# Patient Record
Sex: Female | Born: 1945
Health system: Southern US, Community
[De-identification: ages and names within clinical notes are randomized; demographics above are authoritative.]

## PROBLEM LIST (undated history)

## (undated) DIAGNOSIS — F329 Major depressive disorder, single episode, unspecified: Secondary | ICD-10-CM

## (undated) DIAGNOSIS — M792 Neuralgia and neuritis, unspecified: Secondary | ICD-10-CM

## (undated) DIAGNOSIS — F32A Depression, unspecified: Secondary | ICD-10-CM

## (undated) DIAGNOSIS — J449 Chronic obstructive pulmonary disease, unspecified: Secondary | ICD-10-CM

## (undated) DIAGNOSIS — I10 Essential (primary) hypertension: Secondary | ICD-10-CM

## (undated) DIAGNOSIS — M1990 Unspecified osteoarthritis, unspecified site: Secondary | ICD-10-CM

## (undated) DIAGNOSIS — R0602 Shortness of breath: Secondary | ICD-10-CM

## (undated) HISTORY — PX: FRACTURE SURGERY: SHX138

## (undated) HISTORY — DX: Chronic obstructive pulmonary disease, unspecified: J44.9

## (undated) HISTORY — PX: HERNIA REPAIR: SHX51

## (undated) HISTORY — PX: EXPLORATION MIDDLE EAR: SUR585

---

## 1998-07-10 HISTORY — PX: CHOLECYSTECTOMY: SHX55

## 2006-01-02 ENCOUNTER — Ambulatory Visit: Payer: Self-pay | Admitting: Cardiology

## 2006-01-09 ENCOUNTER — Ambulatory Visit: Payer: Self-pay | Admitting: Cardiology

## 2006-01-11 ENCOUNTER — Ambulatory Visit: Payer: Self-pay | Admitting: Cardiology

## 2006-02-08 ENCOUNTER — Ambulatory Visit: Payer: Self-pay | Admitting: Cardiology

## 2012-05-09 ENCOUNTER — Encounter (HOSPITAL_COMMUNITY): Payer: Self-pay | Admitting: Pharmacy Technician

## 2012-05-10 NOTE — Patient Instructions (Signed)
Kayla Payne  05/10/2012   Your procedure is scheduled on:  05/16/12  Report to [facility] at 1140 AM.  Call this number if you have problems the morning of surgery: [phone]   Remember:   Do not eat food:After Midnight.  May have clear liquids:until Midnight .  Clear liquids include soda, tea, black coffee, apple or grape juice, broth.  Take these medicines the morning of surgery with A SIP OF WATER: prozac, norvasc, hyzaar, neurontin   Do not wear jewelry, make-up or nail polish.  Do not wear lotions, powders, or perfumes. You may wear deodorant.  Do not shave 48 hours prior to surgery. Men may shave face and neck.  Do not bring valuables to the hospital.  Contacts, dentures or bridgework may not be worn into surgery.  Leave suitcase in the car. After surgery it may be brought to your room.  For patients admitted to the hospital, checkout time is 11:00 AM the day of discharge.   Patients discharged the day of surgery will not be allowed to drive home.  Name and phone number of your driver: family  Special Instructions: N/A   Please read over the following fact sheets that you were given: Anesthesia Post-op Instructions and Care and Recovery After Surgery   PATIENT INSTRUCTIONS POST-ANESTHESIA  IMMEDIATELY FOLLOWING SURGERY:  Do not drive or operate machinery for the first twenty four hours after surgery.  Do not make any important decisions for twenty four hours after surgery or while taking narcotic pain medications or sedatives.  If you develop intractable nausea and vomiting or a severe headache please notify your doctor immediately.  FOLLOW-UP:  Please make an appointment with your surgeon as instructed. You do not need to follow up with anesthesia unless specifically instructed to do so.  WOUND CARE INSTRUCTIONS (if applicable):  Keep a dry clean dressing on the anesthesia/puncture wound site if there is drainage.  Once the wound has quit draining you may leave it open  to air.  Generally you should leave the bandage intact for twenty four hours unless there is drainage.  If the epidural site drains for more than 36-48 hours please call the anesthesia department.  QUESTIONS?:  Please feel free to call your physician or the hospital operator if you have any questions, and they will be happy to assist you.      Cataract Surgery  A cataract is a clouding of the lens of the eye. When a lens becomes cloudy, vision is reduced based on the degree and nature of the clouding. Surgery may be needed to improve vision. Surgery removes the cloudy lens and usually replaces it with a substitute lens (intraocular lens, IOL). LET YOUR EYE DOCTOR KNOW ABOUT:  Allergies to food or medicine.  Medicines taken including herbs, eyedrops, over-the-counter medicines, and creams.  Use of steroids (by mouth or creams).  Previous problems with anesthetics or numbing medicine.  History of bleeding problems or blood clots.  Previous surgery.  Other health problems, including diabetes and kidney problems.  Possibility of pregnancy, if this applies. RISKS AND COMPLICATIONS  Infection.  Inflammation of the eyeball (endophthalmitis) that can spread to both eyes (sympathetic ophthalmia).  Poor wound healing.  If an IOL is inserted, it can later fall out of proper position. This is very uncommon.  Clouding of the part of your eye that holds an IOL in place. This is called an "after-cataract." These are uncommon, but easily treated. BEFORE THE PROCEDURE  Do not  eat or drink anything except small amounts of water for 8 to 12 before your surgery, or as directed by your caregiver.  Unless you are told otherwise, continue any eyedrops you have been prescribed.  Talk to your primary caregiver about all other medicines that you take (both prescription and non-prescription). In some cases, you may need to stop or change medicines near the time of your surgery. This is most important  if you are taking blood-thinning medicine.Do not stop medicines unless you are told to do so.  Arrange for someone to drive you to and from the procedure.  Do not put contact lenses in either eye on the day of your surgery. PROCEDURE There is more than one method for safely removing a cataract. Your doctor can explain the differences and help determine which is best for you. Phacoemulsification surgery is the most common form of cataract surgery.  An injection is given behind the eye or eyedrops are given to make this a painless procedure.  A small cut (incision) is made on the edge of the clear, dome-shaped surface that covers the front of the eye (cornea).  A tiny probe is painlessly inserted into the eye. This device gives off ultrasound waves that soften and break up the cloudy center of the lens. This makes it easier for the cloudy lens to be removed by suction.  An IOL may be implanted.  The normal lens of the eye is covered by a clear capsule. Part of that capsule is intentionally left in the eye to support the IOL.  Your surgeon may or may not use stitches to close the incision. There are other forms of cataract surgery that require a larger incision and stiches to close the eye. This approach is taken in cases where the doctor feels that the cataract cannot be easily removed using phacoemulsification. AFTER THE PROCEDURE  When an IOL is implanted, it does not need care. It becomes a permanent part of your eye and cannot be seen or felt.  Your doctor will schedule follow-up exams to check on your progress.  Review your other medicines with your doctor to see which can be resumed after surgery.  Use eyedrops or take medicine as prescribed by your doctor. Document Released: 06/15/2011 Document Revised: 09/18/2011 Document Reviewed: 06/15/2011 [facility] Patient Information 2013 [place].

## 2012-05-13 ENCOUNTER — Encounter (HOSPITAL_COMMUNITY)
Admission: RE | Admit: 2012-05-13 | Discharge: 2012-05-13 | Disposition: A | Discharge status: Home / Self Care | Payer: Medicare Other | Source: Ambulatory Visit | Attending: Ophthalmology | Admitting: Ophthalmology

## 2012-05-13 ENCOUNTER — Encounter (HOSPITAL_COMMUNITY): Payer: Self-pay

## 2012-05-13 HISTORY — DX: Essential (primary) hypertension: I10

## 2012-05-13 HISTORY — DX: Unspecified osteoarthritis, unspecified site: M19.90

## 2012-05-13 HISTORY — DX: Neuralgia and neuritis, unspecified: M79.2

## 2012-05-13 HISTORY — DX: Major depressive disorder, single episode, unspecified: F32.9

## 2012-05-13 HISTORY — DX: Depression, unspecified: F32.A

## 2012-05-13 LAB — BASIC METABOLIC PANEL
CO2: 28 mEq/L (ref 19–32)
Chloride: 101 mEq/L (ref 96–112)
Creatinine, Ser: 1.13 mg/dL — ABNORMAL HIGH (ref 0.50–1.10)
GFR calc Af Amer: 57 mL/min — ABNORMAL LOW (ref >90)
Sodium: 139 mEq/L (ref 135–145)

## 2012-05-13 LAB — HEMOGLOBIN AND HEMATOCRIT, BLOOD
HCT: 41.5 % (ref 36.0–46.0)
Hemoglobin: 13.8 g/dL (ref 12.0–15.0)

## 2012-05-15 MED ORDER — PHENYLEPHRINE HCL 2.5 % OP SOLN
OPHTHALMIC | Status: AC
  Filled 2012-05-15: qty 2

## 2012-05-15 MED ORDER — LIDOCAINE HCL 3.5 % OP GEL
OPHTHALMIC | Status: AC
  Filled 2012-05-15: qty 5

## 2012-05-15 MED ORDER — LIDOCAINE HCL (PF) 1 % IJ SOLN
INTRAMUSCULAR | Status: AC
  Filled 2012-05-15: qty 2

## 2012-05-15 MED ORDER — NEOMYCIN-POLYMYXIN-DEXAMETH 3.5-10000-0.1 OP OINT
TOPICAL_OINTMENT | OPHTHALMIC | Status: AC
  Filled 2012-05-15: qty 3.5

## 2012-05-15 MED ORDER — CYCLOPENTOLATE-PHENYLEPHRINE 0.2-1 % OP SOLN
OPHTHALMIC | Status: AC
  Filled 2012-05-15: qty 2

## 2012-05-15 MED ORDER — TETRACAINE HCL 0.5 % OP SOLN
OPHTHALMIC | Status: AC
  Filled 2012-05-15: qty 2

## 2012-05-16 ENCOUNTER — Ambulatory Visit (HOSPITAL_COMMUNITY): Payer: Medicare Other | Admitting: Anesthesiology

## 2012-05-16 ENCOUNTER — Encounter (HOSPITAL_COMMUNITY): Payer: Self-pay | Admitting: *Deleted

## 2012-05-16 ENCOUNTER — Ambulatory Visit (HOSPITAL_COMMUNITY)
Admission: RE | Admit: 2012-05-16 | Discharge: 2012-05-16 | Disposition: A | Discharge status: Home / Self Care | Payer: Medicare Other | Source: Ambulatory Visit | Attending: Ophthalmology | Admitting: Ophthalmology

## 2012-05-16 ENCOUNTER — Encounter (HOSPITAL_COMMUNITY): Payer: Self-pay | Admitting: Anesthesiology

## 2012-05-16 ENCOUNTER — Encounter (HOSPITAL_COMMUNITY)
Admission: RE | Disposition: A | Discharge status: Home / Self Care | Payer: Self-pay | Source: Ambulatory Visit | Attending: Ophthalmology

## 2012-05-16 DIAGNOSIS — I10 Essential (primary) hypertension: Secondary | ICD-10-CM | POA: Insufficient documentation

## 2012-05-16 DIAGNOSIS — Z01810 Encounter for preprocedural cardiovascular examination: Secondary | ICD-10-CM | POA: Insufficient documentation

## 2012-05-16 DIAGNOSIS — Z01812 Encounter for preprocedural laboratory examination: Secondary | ICD-10-CM | POA: Insufficient documentation

## 2012-05-16 DIAGNOSIS — H2589 Other age-related cataract: Secondary | ICD-10-CM | POA: Insufficient documentation

## 2012-05-16 DIAGNOSIS — E119 Type 2 diabetes mellitus without complications: Secondary | ICD-10-CM | POA: Insufficient documentation

## 2012-05-16 HISTORY — PX: CATARACT EXTRACTION W/PHACO: SHX586

## 2012-05-16 LAB — GLUCOSE, CAPILLARY: Glucose-Capillary: 136 mg/dL — ABNORMAL HIGH (ref 70–99)

## 2012-05-16 SURGERY — PHACOEMULSIFICATION, CATARACT, WITH IOL INSERTION
Anesthesia: Monitor Anesthesia Care | Site: Eye | Laterality: Left | Wound class: Clean

## 2012-05-16 MED ORDER — MIDAZOLAM HCL 2 MG/2ML IJ SOLN
1.0000 mg | INTRAMUSCULAR | Status: DC | PRN
  Administered 2012-05-16: 2 mg via INTRAVENOUS

## 2012-05-16 MED ORDER — NEOMYCIN-POLYMYXIN-DEXAMETH 0.1 % OP OINT
TOPICAL_OINTMENT | OPHTHALMIC | Status: DC | PRN
  Administered 2012-05-16: 1 via OPHTHALMIC

## 2012-05-16 MED ORDER — TETRACAINE HCL 0.5 % OP SOLN
1.0000 [drp] | OPHTHALMIC | Status: AC
  Administered 2012-05-16 (×3): 1 [drp] via OPHTHALMIC

## 2012-05-16 MED ORDER — PROVISC 10 MG/ML IO SOLN
INTRAOCULAR | Status: DC | PRN
  Administered 2012-05-16: 8.5 mg via INTRAOCULAR

## 2012-05-16 MED ORDER — BSS IO SOLN
INTRAOCULAR | Status: DC | PRN
  Administered 2012-05-16: 15 mL via INTRAOCULAR

## 2012-05-16 MED ORDER — LACTATED RINGERS IV SOLN
INTRAVENOUS | Status: DC
  Administered 2012-05-16: 1000 mL via INTRAVENOUS

## 2012-05-16 MED ORDER — LIDOCAINE HCL 3.5 % OP GEL
1.0000 "application " | Freq: Once | OPHTHALMIC | Status: AC
  Administered 2012-05-16: 1 via OPHTHALMIC

## 2012-05-16 MED ORDER — EPINEPHRINE HCL 1 MG/ML IJ SOLN
INTRAOCULAR | Status: DC | PRN
  Administered 2012-05-16: 13:00:00

## 2012-05-16 MED ORDER — PHENYLEPHRINE HCL 2.5 % OP SOLN
1.0000 [drp] | OPHTHALMIC | Status: AC
  Administered 2012-05-16 (×3): 1 [drp] via OPHTHALMIC

## 2012-05-16 MED ORDER — CYCLOPENTOLATE-PHENYLEPHRINE 0.2-1 % OP SOLN
1.0000 [drp] | OPHTHALMIC | Status: AC
  Administered 2012-05-16 (×3): 1 [drp] via OPHTHALMIC

## 2012-05-16 MED ORDER — LIDOCAINE 3.5 % OP GEL OPTIME - NO CHARGE
OPHTHALMIC | Status: DC | PRN
  Administered 2012-05-16: 2 [drp] via OPHTHALMIC

## 2012-05-16 MED ORDER — ONDANSETRON HCL 4 MG/2ML IJ SOLN: 4.0000 mg | Freq: Once | INTRAMUSCULAR | Status: AC | PRN

## 2012-05-16 MED ORDER — LIDOCAINE HCL (PF) 1 % IJ SOLN
INTRAMUSCULAR | Status: DC | PRN
  Administered 2012-05-16: .5 mL

## 2012-05-16 MED ORDER — POVIDONE-IODINE 5 % OP SOLN
OPHTHALMIC | Status: DC | PRN
  Administered 2012-05-16: 1 via OPHTHALMIC

## 2012-05-16 MED ORDER — LACTATED RINGERS IV SOLN
INTRAVENOUS | Status: DC | PRN
  Administered 2012-05-16: 13:00:00 via INTRAVENOUS

## 2012-05-16 MED ORDER — FENTANYL CITRATE 0.05 MG/ML IJ SOLN: 25.0000 ug | INTRAMUSCULAR | Status: DC | PRN

## 2012-05-16 MED ORDER — MIDAZOLAM HCL 2 MG/2ML IJ SOLN
INTRAMUSCULAR | Status: AC
  Filled 2012-05-16: qty 2

## 2012-05-16 SURGICAL SUPPLY — 32 items
CAPSULAR TENSION RING-AMO (OPHTHALMIC RELATED) IMPLANT
CLOTH BEACON ORANGE TIMEOUT ST (SAFETY) ×2 IMPLANT
EYE SHIELD UNIVERSAL CLEAR (GAUZE/BANDAGES/DRESSINGS) ×2 IMPLANT
GLOVE BIO SURGEON STRL SZ 6.5 (GLOVE) IMPLANT
GLOVE BIOGEL PI IND STRL 6.5 (GLOVE) ×1 IMPLANT
GLOVE BIOGEL PI IND STRL 7.0 (GLOVE) IMPLANT
GLOVE BIOGEL PI IND STRL 7.5 (GLOVE) IMPLANT
GLOVE BIOGEL PI INDICATOR 6.5 (GLOVE) ×1
GLOVE BIOGEL PI INDICATOR 7.0 (GLOVE)
GLOVE BIOGEL PI INDICATOR 7.5 (GLOVE)
GLOVE ECLIPSE 6.5 STRL STRAW (GLOVE) ×2 IMPLANT
GLOVE ECLIPSE 7.0 STRL STRAW (GLOVE) IMPLANT
GLOVE ECLIPSE 7.5 STRL STRAW (GLOVE) IMPLANT
GLOVE EXAM NITRILE LRG STRL (GLOVE) IMPLANT
GLOVE EXAM NITRILE MD LF STRL (GLOVE) ×2 IMPLANT
GLOVE SKINSENSE NS SZ6.5 (GLOVE)
GLOVE SKINSENSE NS SZ7.0 (GLOVE)
GLOVE SKINSENSE STRL SZ6.5 (GLOVE) IMPLANT
GLOVE SKINSENSE STRL SZ7.0 (GLOVE) IMPLANT
KIT VITRECTOMY (OPHTHALMIC RELATED) IMPLANT
PAD ARMBOARD 7.5X6 YLW CONV (MISCELLANEOUS) ×2 IMPLANT
PROC W NO LENS (INTRAOCULAR LENS)
PROC W SPEC LENS (INTRAOCULAR LENS)
PROCESS W NO LENS (INTRAOCULAR LENS) IMPLANT
PROCESS W SPEC LENS (INTRAOCULAR LENS) IMPLANT
RING MALYGIN (MISCELLANEOUS) IMPLANT
SIGHTPATH CAT PROC W REG LENS (Ophthalmic Related) ×2 IMPLANT
SYR TB 1ML LL NO SAFETY (SYRINGE) ×2 IMPLANT
TAPE SURG TRANSPORE 1 IN (GAUZE/BANDAGES/DRESSINGS) ×1 IMPLANT
TAPE SURGICAL TRANSPORE 1 IN (GAUZE/BANDAGES/DRESSINGS) ×1
VISCOELASTIC ADDITIONAL (OPHTHALMIC RELATED) IMPLANT
WATER STERILE IRR 250ML POUR (IV SOLUTION) ×2 IMPLANT

## 2012-05-16 NOTE — H&P (Signed)
I have reviewed the H&P, the patient was re-examined, and I have identified no interval changes in medical condition and plan of care since the history and physical of record  

## 2012-05-16 NOTE — Preoperative (Signed)
Beta Blockers   Reason not to administer Beta Blockers:Not Applicable 

## 2012-05-16 NOTE — Anesthesia Procedure Notes (Signed)
Procedure Name: MAC Date/Time: 05/16/2012 1:25 PM Performed by: Antony Contras, Tedrick Port L Pre-anesthesia Checklist: Patient identified, Patient being monitored, Emergency Drugs available, Timeout performed and Suction available Oxygen Delivery Method: Nasal cannula

## 2012-05-16 NOTE — Transfer of Care (Signed)
  Anesthesia Post-op Note  Patient: Kayla Payne  Procedure(s) Performed: Procedure(s) (LRB) with comments: CATARACT EXTRACTION PHACO AND INTRAOCULAR LENS PLACEMENT (IOC) (Left) - CDE 9.31  Patient Location: PACU  Anesthesia Type: MAC  Level of Consciousness: awake, alert , oriented and patient cooperative  Airway and Oxygen Therapy: Patient Spontanous Breathing room air  Post-op Pain: mild  Post-op Assessment: Post-op Vital signs reviewed, Patient's Cardiovascular Status Stable, Respiratory Function Stable, Patent Airway and No signs of Nausea or vomiting  Post-op Vital Signs: Reviewed and stable  Complications: No apparent anesthesia complications

## 2012-05-16 NOTE — Anesthesia Preprocedure Evaluation (Signed)
Anesthesia Evaluation  Patient identified by MRN, date of birth, ID band Patient awake    Reviewed: Allergy & Precautions, H&P , NPO status , Patient's Chart, lab work & pertinent test results  Airway Mallampati: III      Dental  (+) Edentulous Upper and Partial Lower   Pulmonary  breath sounds clear to auscultation        Cardiovascular hypertension, Pt. on medications Rhythm:Regular     Neuro/Psych PSYCHIATRIC DISORDERS Depression  Neuromuscular disease (trigeminal neuralgia)    GI/Hepatic   Endo/Other  diabetes  Renal/GU      Musculoskeletal   Abdominal   Peds  Hematology   Anesthesia Other Findings   Reproductive/Obstetrics                           Anesthesia Physical Anesthesia Plan  ASA: III  Anesthesia Plan: MAC   Post-op Pain Management:    Induction: Intravenous  Airway Management Planned: Nasal Cannula  Additional Equipment:   Intra-op Plan:   Post-operative Plan:   Informed Consent: I have reviewed the patients History and Physical, chart, labs and discussed the procedure including the risks, benefits and alternatives for the proposed anesthesia with the patient or authorized representative who has indicated his/her understanding and acceptance.     Plan Discussed with:   Anesthesia Plan Comments:         Anesthesia Quick Evaluation

## 2012-05-16 NOTE — Brief Op Note (Signed)
Pre-Op Dx: Cataract OS Post-Op Dx: Cataract OS Surgeon: Lucciana Head Anesthesia: Topical with MAC Surgery: Cataract Extraction with Intraocular lens Implant OS Implant: B&L enVista Specimen: None Complications: None 

## 2012-05-16 NOTE — Anesthesia Postprocedure Evaluation (Signed)
  Anesthesia Post-op Note  Patient: Kayla Payne  Procedure(s) Performed: Procedure(s) (LRB) with comments: CATARACT EXTRACTION PHACO AND INTRAOCULAR LENS PLACEMENT (IOC) (Left) - CDE 9.31  Patient Location: PACU  Anesthesia Type: MAC  Level of Consciousness: awake, alert , oriented and patient cooperative  Airway and Oxygen Therapy: Patient Spontanous Breathing room air  Post-op Pain: mild  Post-op Assessment: Post-op Vital signs reviewed, Patient's Cardiovascular Status Stable, Respiratory Function Stable, Patent Airway and No signs of Nausea or vomiting  Post-op Vital Signs: Reviewed and stable  Complications: No apparent anesthesia complications

## 2012-05-17 NOTE — Op Note (Signed)
Kayla Payne, MEEKER NO.:  [id number]  MEDICAL RECORD NO.:  [id number]  LOCATION:  [facility]                          FACILITY:  [facility]  PHYSICIAN:  Richardo Hanks, MD       DATE OF BIRTH:  12-03-1945  DATE OF PROCEDURE:  05/16/2012 DATE OF DISCHARGE:                              OPERATIVE REPORT   PREOPERATIVE DIAGNOSIS:  Combined cataract, left eye.  POSTOPERATIVE DIAGNOSIS:  Combined cataract, left eye.  OPERATION PERFORMED:  Phacoemulsification with posterior chamber intraocular lens implantation, left eye.  SURGEON:  Franky Macho. Debany Vantol, MD  ANESTHESIA:  Topical with monitored anesthesia care and IV sedation.  OPERATIVE SUMMARY:  In the preoperative area, dilating drops were placed into the left eye.  The patient was then brought into the operating room where she was placed under general anesthesia.  The eye was then prepped and draped.  Beginning with a 75 blade, a paracentesis port was made at the surgeon's 2 o'clock position.  The anterior chamber was then filled with a 1% nonpreserved lidocaine solution with epinephrine.  This was followed by Viscoat to deepen the chamber.  A small fornix-based peritomy was performed superiorly.  Next, a single iris hook was placed through the limbus superiorly.  A 2.4-mm keratome blade was then used to make a clear corneal incision over the iris hook.  A bent cystotome needle and Utrata forceps were used to create a continuous tear capsulotomy.  Hydrodissection was performed using balanced salt solution on a fine cannula.  The lens nucleus was then removed using phacoemulsification in a quadrant cracking technique.  The cortical material was then removed with irrigation and aspiration.  The capsular bag and anterior chamber were refilled with Provisc.  The wound was widened to approximately 3 mm and a posterior chamber intraocular lens was placed into the capsular bag without difficulty using an [employer] lens injecting  system.  A single 10-0 nylon suture was then used to close the incision as well as stromal hydration.  The Provisc was removed from the anterior chamber and capsular bag with irrigation and aspiration.  At this point, the wounds were tested for leak, which were negative.  The anterior chamber remained deep and stable.  The patient tolerated the procedure well.  There were no operative complications, and she awoke from general anesthesia without problem.  No surgical specimens.  Prosthetic device used is a [employer], model EnVista, model number MX60, power of 25.5, serial number is [id number].          ______________________________ Richardo Hanks, MD     KEH/MEDQ  D:  05/16/2012  T:  05/17/2012  Job:  [id number]

## 2012-05-20 ENCOUNTER — Encounter (HOSPITAL_COMMUNITY): Payer: Self-pay | Admitting: Ophthalmology

## 2012-08-12 ENCOUNTER — Encounter (HOSPITAL_COMMUNITY): Payer: Self-pay | Admitting: Pharmacy Technician

## 2012-08-14 NOTE — Patient Instructions (Addendum)
Your procedure is scheduled on: 08/19/2012  Report to [facility] at  59    AM.  Call this number if you have problems the morning of surgery: [phone]   Do not eat food or drink liquids :After Midnight.      Take these medicines the morning of surgery with A SIP OF WATER:prozac,vicodin,norvasc,celebrex,neurontin,hyzaar   Do not wear jewelry, make-up or nail polish.  Do not wear lotions, powders, or perfumes.   Do not shave 48 hours prior to surgery.  Do not bring valuables to the hospital.  Contacts, dentures or bridgework may not be worn into surgery.  Leave suitcase in the car. After surgery it may be brought to your room.  For patients admitted to the hospital, checkout time is 11:00 AM the day of discharge.   Patients discharged the day of surgery will not be allowed to drive home.  :     Please read over the following fact sheets that you were given: Coughing and Deep Breathing, Surgical Site Infection Prevention, Anesthesia Post-op Instructions and Care and Recovery After Surgery    Cataract A cataract is a clouding of the lens of the eye. When a lens becomes cloudy, vision is reduced based on the degree and nature of the clouding. Many cataracts reduce vision to some degree. Some cataracts make people more near-sighted as they develop. Other cataracts increase glare. Cataracts that are ignored and become worse can sometimes look white. The white color can be seen through the pupil. CAUSES   Aging. However, cataracts may occur at any age, even in newborns.   Certain drugs.   Trauma to the eye.   Certain diseases such as diabetes.   Specific eye diseases such as chronic inflammation inside the eye or a sudden attack of a rare form of glaucoma.   Inherited or acquired medical problems.  SYMPTOMS   Gradual, progressive drop in vision in the affected eye.   Severe, rapid visual loss. This most often happens when trauma is the cause.  DIAGNOSIS  To detect a cataract, an  eye doctor examines the lens. Cataracts are best diagnosed with an exam of the eyes with the pupils enlarged (dilated) by drops.  TREATMENT  For an early cataract, vision may improve by using different eyeglasses or stronger lighting. If that does not help your vision, surgery is the only effective treatment. A cataract needs to be surgically removed when vision loss interferes with your everyday activities, such as driving, reading, or watching TV. A cataract may also have to be removed if it prevents examination or treatment of another eye problem. Surgery removes the cloudy lens and usually replaces it with a substitute lens (intraocular lens, IOL).  At a time when both you and your doctor agree, the cataract will be surgically removed. If you have cataracts in both eyes, only one is usually removed at a time. This allows the operated eye to heal and be out of danger from any possible problems after surgery (such as infection or poor wound healing). In rare cases, a cataract may be doing damage to your eye. In these cases, your caregiver may advise surgical removal right away. The vast majority of people who have cataract surgery have better vision afterward. HOME CARE INSTRUCTIONS  If you are not planning surgery, you may be asked to do the following:  Use different eyeglasses.   Use stronger or brighter lighting.   Ask your eye doctor about reducing your medicine dose or changing medicines  if it is thought that a medicine caused your cataract. Changing medicines does not make the cataract go away on its own.   Become familiar with your surroundings. Poor vision can lead to injury. Avoid bumping into things on the affected side. You are at a higher risk for tripping or falling.   Exercise extreme care when driving or operating machinery.   Wear sunglasses if you are sensitive to bright light or experiencing problems with glare.  SEEK IMMEDIATE MEDICAL CARE IF:   You have a worsening or sudden  vision loss.   You notice redness, swelling, or increasing pain in the eye.   You have a fever.  Document Released: 06/26/2005 Document Revised: 06/15/2011 Document Reviewed: 02/17/2011 [facility] Patient Information 2012 [place].PATIENT INSTRUCTIONS POST-ANESTHESIA  IMMEDIATELY FOLLOWING SURGERY:  Do not drive or operate machinery for the first twenty four hours after surgery.  Do not make any important decisions for twenty four hours after surgery or while taking narcotic pain medications or sedatives.  If you develop intractable nausea and vomiting or a severe headache please notify your doctor immediately.  FOLLOW-UP:  Please make an appointment with your surgeon as instructed. You do not need to follow up with anesthesia unless specifically instructed to do so.  WOUND CARE INSTRUCTIONS (if applicable):  Keep a dry clean dressing on the anesthesia/puncture wound site if there is drainage.  Once the wound has quit draining you may leave it open to air.  Generally you should leave the bandage intact for twenty four hours unless there is drainage.  If the epidural site drains for more than 36-48 hours please call the anesthesia department.  QUESTIONS?:  Please feel free to call your physician or the hospital operator if you have any questions, and they will be happy to assist you.

## 2012-08-15 ENCOUNTER — Encounter (HOSPITAL_COMMUNITY)
Admission: RE | Admit: 2012-08-15 | Discharge: 2012-08-15 | Disposition: A | Discharge status: Home / Self Care | Payer: Medicare Other | Source: Ambulatory Visit | Attending: Ophthalmology | Admitting: Ophthalmology

## 2012-08-15 ENCOUNTER — Encounter (HOSPITAL_COMMUNITY): Payer: Self-pay

## 2012-08-15 HISTORY — DX: Shortness of breath: R06.02

## 2012-08-15 LAB — BASIC METABOLIC PANEL
BUN: 20 mg/dL (ref 6–23)
Calcium: 9.9 mg/dL (ref 8.4–10.5)
GFR calc Af Amer: 56 mL/min — ABNORMAL LOW (ref >90)
GFR calc non Af Amer: 48 mL/min — ABNORMAL LOW (ref >90)
Potassium: 3.8 mEq/L (ref 3.5–5.1)

## 2012-08-16 MED ORDER — TETRACAINE HCL 0.5 % OP SOLN
OPHTHALMIC | Status: AC
  Filled 2012-08-16: qty 2

## 2012-08-16 MED ORDER — NEOMYCIN-POLYMYXIN-DEXAMETH 3.5-10000-0.1 OP OINT
TOPICAL_OINTMENT | OPHTHALMIC | Status: AC
  Filled 2012-08-16: qty 3.5

## 2012-08-16 MED ORDER — LIDOCAINE HCL (PF) 1 % IJ SOLN
INTRAMUSCULAR | Status: AC
  Filled 2012-08-16: qty 2

## 2012-08-16 MED ORDER — LIDOCAINE HCL 3.5 % OP GEL
OPHTHALMIC | Status: AC
  Filled 2012-08-16: qty 5

## 2012-08-16 MED ORDER — CYCLOPENTOLATE-PHENYLEPHRINE 0.2-1 % OP SOLN
OPHTHALMIC | Status: AC
  Filled 2012-08-16: qty 2

## 2012-08-19 ENCOUNTER — Encounter (HOSPITAL_COMMUNITY): Payer: Self-pay | Admitting: Anesthesiology

## 2012-08-19 ENCOUNTER — Encounter (HOSPITAL_COMMUNITY)
Admission: RE | Disposition: A | Discharge status: Home / Self Care | Payer: Self-pay | Source: Ambulatory Visit | Attending: Ophthalmology

## 2012-08-19 ENCOUNTER — Ambulatory Visit (HOSPITAL_COMMUNITY): Payer: Medicare Other | Admitting: Anesthesiology

## 2012-08-19 ENCOUNTER — Encounter (HOSPITAL_COMMUNITY): Payer: Self-pay | Admitting: *Deleted

## 2012-08-19 ENCOUNTER — Ambulatory Visit (HOSPITAL_COMMUNITY)
Admission: RE | Admit: 2012-08-19 | Discharge: 2012-08-19 | Disposition: A | Discharge status: Home / Self Care | Payer: Medicare Other | Source: Ambulatory Visit | Attending: Ophthalmology | Admitting: Ophthalmology

## 2012-08-19 DIAGNOSIS — I10 Essential (primary) hypertension: Secondary | ICD-10-CM | POA: Insufficient documentation

## 2012-08-19 DIAGNOSIS — H2589 Other age-related cataract: Secondary | ICD-10-CM | POA: Insufficient documentation

## 2012-08-19 DIAGNOSIS — Z01812 Encounter for preprocedural laboratory examination: Secondary | ICD-10-CM | POA: Insufficient documentation

## 2012-08-19 HISTORY — PX: CATARACT EXTRACTION W/PHACO: SHX586

## 2012-08-19 SURGERY — PHACOEMULSIFICATION, CATARACT, WITH IOL INSERTION
Anesthesia: Monitor Anesthesia Care | Site: Eye | Laterality: Right | Wound class: Clean

## 2012-08-19 MED ORDER — BSS IO SOLN
INTRAOCULAR | Status: DC | PRN
  Administered 2012-08-19: 15 mL via INTRAOCULAR

## 2012-08-19 MED ORDER — NEOMYCIN-POLYMYXIN-DEXAMETH 0.1 % OP OINT
TOPICAL_OINTMENT | OPHTHALMIC | Status: DC | PRN
  Administered 2012-08-19: 1 via OPHTHALMIC

## 2012-08-19 MED ORDER — LIDOCAINE HCL (PF) 1 % IJ SOLN
INTRAMUSCULAR | Status: DC | PRN
  Administered 2012-08-19: .5 mL

## 2012-08-19 MED ORDER — PHENYLEPHRINE HCL 2.5 % OP SOLN
OPHTHALMIC | Status: AC
  Filled 2012-08-19: qty 2

## 2012-08-19 MED ORDER — LACTATED RINGERS IV SOLN
INTRAVENOUS | Status: DC
  Administered 2012-08-19: 11:00:00 via INTRAVENOUS

## 2012-08-19 MED ORDER — CYCLOPENTOLATE-PHENYLEPHRINE 0.2-1 % OP SOLN
1.0000 [drp] | OPHTHALMIC | Status: AC
  Administered 2012-08-19 (×3): 1 [drp] via OPHTHALMIC

## 2012-08-19 MED ORDER — NA HYALUR & NA CHOND-NA HYALUR 0.55-0.5 ML IO KIT
PACK | INTRAOCULAR | Status: DC | PRN
  Administered 2012-08-19: 1 via OPHTHALMIC

## 2012-08-19 MED ORDER — ONDANSETRON HCL 4 MG/2ML IJ SOLN: 4.0000 mg | Freq: Once | INTRAMUSCULAR | Status: DC | PRN

## 2012-08-19 MED ORDER — EPINEPHRINE HCL 1 MG/ML IJ SOLN
INTRAMUSCULAR | Status: AC
  Filled 2012-08-19: qty 1

## 2012-08-19 MED ORDER — POVIDONE-IODINE 5 % OP SOLN
OPHTHALMIC | Status: DC | PRN
  Administered 2012-08-19: 1 via OPHTHALMIC

## 2012-08-19 MED ORDER — LIDOCAINE HCL 3.5 % OP GEL
1.0000 "application " | Freq: Once | OPHTHALMIC | Status: AC
  Administered 2012-08-19: 1 via OPHTHALMIC

## 2012-08-19 MED ORDER — PROVISC 10 MG/ML IO SOLN: INTRAOCULAR | Status: DC | PRN

## 2012-08-19 MED ORDER — TETRACAINE HCL 0.5 % OP SOLN
1.0000 [drp] | OPHTHALMIC | Status: AC
  Administered 2012-08-19 (×3): 1 [drp] via OPHTHALMIC

## 2012-08-19 MED ORDER — LIDOCAINE 3.5 % OP GEL OPTIME - NO CHARGE
OPHTHALMIC | Status: DC | PRN
  Administered 2012-08-19: 1 [drp] via OPHTHALMIC

## 2012-08-19 MED ORDER — FENTANYL CITRATE 0.05 MG/ML IJ SOLN: 25.0000 ug | INTRAMUSCULAR | Status: DC | PRN

## 2012-08-19 MED ORDER — MIDAZOLAM HCL 2 MG/2ML IJ SOLN
INTRAMUSCULAR | Status: AC
  Filled 2012-08-19: qty 2

## 2012-08-19 MED ORDER — PHENYLEPHRINE HCL 2.5 % OP SOLN
1.0000 [drp] | OPHTHALMIC | Status: AC
  Administered 2012-08-19 (×3): 1 [drp] via OPHTHALMIC

## 2012-08-19 MED ORDER — EPINEPHRINE HCL 1 MG/ML IJ SOLN
INTRAOCULAR | Status: DC | PRN
  Administered 2012-08-19: 12:00:00

## 2012-08-19 MED ORDER — MIDAZOLAM HCL 2 MG/2ML IJ SOLN
1.0000 mg | INTRAMUSCULAR | Status: DC | PRN
  Administered 2012-08-19 (×2): 1 mg via INTRAVENOUS

## 2012-08-19 SURGICAL SUPPLY — 32 items

## 2012-08-19 NOTE — Anesthesia Preprocedure Evaluation (Signed)
Anesthesia Evaluation  Patient identified by MRN, date of birth, ID band Patient awake    Reviewed: Allergy & Precautions, H&P , NPO status , Patient's Chart, lab work & pertinent test results  Airway Mallampati: III      Dental  (+) Edentulous Upper and Partial Lower   Pulmonary shortness of breath,  breath sounds clear to auscultation        Cardiovascular hypertension, Pt. on medications Rhythm:Regular     Neuro/Psych PSYCHIATRIC DISORDERS Depression  Neuromuscular disease (trigeminal neuralgia)    GI/Hepatic   Endo/Other  diabetesMorbid obesity  Renal/GU      Musculoskeletal   Abdominal   Peds  Hematology   Anesthesia Other Findings   Reproductive/Obstetrics                           Anesthesia Physical Anesthesia Plan  ASA: III  Anesthesia Plan: MAC   Post-op Pain Management:    Induction: Intravenous  Airway Management Planned: Nasal Cannula  Additional Equipment:   Intra-op Plan:   Post-operative Plan:   Informed Consent: I have reviewed the patients History and Physical, chart, labs and discussed the procedure including the risks, benefits and alternatives for the proposed anesthesia with the patient or authorized representative who has indicated his/her understanding and acceptance.     Plan Discussed with:   Anesthesia Plan Comments:         Anesthesia Quick Evaluation

## 2012-08-19 NOTE — Transfer of Care (Signed)
Immediate Anesthesia Transfer of Care Note  Patient: Kayla Payne  Procedure(s) Performed: Procedure(s) with comments: CATARACT EXTRACTION PHACO AND INTRAOCULAR LENS PLACEMENT (IOC) (Right) - CDE: 9.46  Patient Location: PACU and Short Stay  Anesthesia Type:MAC  Level of Consciousness: awake  Airway & Oxygen Therapy: Patient Spontanous Breathing  Post-op Assessment: Report given to PACU RN  Post vital signs: Reviewed and stable  Complications: No apparent anesthesia complications

## 2012-08-19 NOTE — H&P (Signed)
I have reviewed the H&P, the patient was re-examined, and I have identified no interval changes in medical condition and plan of care since the history and physical of record  

## 2012-08-19 NOTE — Anesthesia Postprocedure Evaluation (Signed)
  Anesthesia Post-op Note  Patient: Kayla Payne  Procedure(s) Performed: Procedure(s) with comments: CATARACT EXTRACTION PHACO AND INTRAOCULAR LENS PLACEMENT (IOC) (Right) - CDE: 9.46  Patient Location: PACU and Short Stay  Anesthesia Type:MAC  Level of Consciousness: awake, alert  and oriented  Airway and Oxygen Therapy: Patient Spontanous Breathing  Post-op Pain: none  Post-op Assessment: Post-op Vital signs reviewed, Patient's Cardiovascular Status Stable, Respiratory Function Stable, Patent Airway and No signs of Nausea or vomiting  Post-op Vital Signs: stable  Complications: No apparent anesthesia complications

## 2012-08-19 NOTE — Brief Op Note (Signed)
Pre-Op Dx: Cataract OD Post-Op Dx: Cataract OD Surgeon: Tonny Branch Anesthesia: Topical with MAC Surgery: Cataract Extraction with Intraocular lens Implant OD Implant: B&L enVista Specimen: None Complications: None

## 2012-08-20 ENCOUNTER — Encounter (HOSPITAL_COMMUNITY): Payer: Self-pay | Admitting: Ophthalmology

## 2012-08-20 NOTE — Op Note (Signed)
[name], SI NO.:  [id number]  MEDICAL RECORD NO.:  [id number]  LOCATION:  [facility]PO                          FACILITY:  [facility]  PHYSICIAN:  Kayla Hanks, MD       DATE OF BIRTH:  [birth year]  DATE OF PROCEDURE:  08/19/2012 DATE OF DISCHARGE:  08/19/2012                              OPERATIVE REPORT   PREOPERATIVE DIAGNOSIS:  Combined cataract, right eye, diagnosis code 366.19.  POSTOPERATIVE DIAGNOSIS:  Combined cataract, right eye, diagnosis code 366.19.  OPERATION PERFORMED:  Phacoemulsification with posterior chamber intraocular lens implantation, right eye.  SURGEON:  Franky Macho. Damali Broadfoot, MD.  ANESTHESIA:  Topical with monitored anesthesia care and IV sedation.  OPERATIVE SUMMARY:  In the preoperative area, dilating drops were placed into the right eye.  The patient was then brought into the operating room where she was placed under general anesthesia.  The eye was then prepped and draped.  Beginning with a 75 blade, a paracentesis port was made at the surgeon's 2 o'clock position.  The anterior chamber was then filled with a 1% nonpreserved lidocaine solution with epinephrine.  This was followed by Viscoat to deepen the chamber.  A small fornix-based peritomy was performed superiorly.  Next, a single iris hook was placed through the limbus superiorly.  A 2.4-mm keratome blade was then used to make a clear corneal incision over the iris hook.  A bent cystotome needle and Utrata forceps were used to create a continuous tear capsulotomy.  Hydrodissection was performed using balanced salt solution on a fine cannula.  The lens nucleus was then removed using phacoemulsification in a quadrant cracking technique.  The cortical material was then removed with irrigation and aspiration.  The capsular bag and anterior chamber were refilled with Provisc.  The wound was widened to approximately 3 mm and a posterior chamber intraocular lens was placed into the capsular  bag without difficulty using an [employer] lens injecting system.  A single 10-0 nylon suture was then used to close the incision as well as stromal hydration.  The Provisc was removed from the anterior chamber and capsular bag with irrigation and aspiration.  At this point, the wounds were tested for leak, which were negative.  The anterior chamber remained deep and stable.  The patient tolerated the procedure well.  There were no operative complications, and she awoke from general anesthesia without problem.  No surgical specimens.  Prosthetic device used [employer] EnVista posterior chamber lens, model MX60, power of 26.5, serial number is [id number].          ______________________________ Kayla Hanks, MD     KEH/MEDQ  D:  08/19/2012  T:  08/20/2012  Job:  [id number]

## 2013-01-06 ENCOUNTER — Telehealth (HOSPITAL_COMMUNITY): Payer: Self-pay | Admitting: Dietician

## 2013-01-06 NOTE — Telephone Encounter (Addendum)
Received voicemail from Stephenson, referrals coordinator at [place] on 01/02/13 at 1414. Pt requiring diabetes education.

## 2013-01-06 NOTE — Telephone Encounter (Signed)
Returned call at 1417. Referral is from Dr. Olena Heckle. According to Kayla Payne, pt "doesn't understand anything about diabetes". Ann to fax pt demographics, labs, and last office note.

## 2013-01-07 NOTE — Telephone Encounter (Signed)
Received voicemail from pt at 1421. Appointment scheduled for 01/20/13 at 1400.

## 2013-01-07 NOTE — Telephone Encounter (Signed)
Called and left message on pt voicemail at [phone].

## 2013-01-15 ENCOUNTER — Other Ambulatory Visit: Payer: Self-pay | Admitting: *Deleted

## 2013-01-20 ENCOUNTER — Encounter (HOSPITAL_COMMUNITY): Payer: Self-pay | Admitting: Dietician

## 2013-01-20 NOTE — Progress Notes (Signed)
Outpatient Initial Nutrition Assessment  Date:01/20/2013   Appt Start Time: 1347  Referring Physician: Dr. Olena Heckle ([facility]) Reason for Visit: diabetes  Nutrition Assessment:  Height: 4\' 11"  (149.9 cm)   Weight: 242 lb (109.77 kg)   IBW: 98# %IBW: 247% UBW: 242# %UBW: 100% Body mass index is 48.85 kg/(m^2).  Meets criteria for extreme obesity, class III. Goal Weight: 218# (10% weight loss of current weight). Weight hx: Reports UBW of 242# for the past 3-4 years. She reports progressive weight gain since then, due to physical inactivity due to knee problems.   Estimated nutritional needs: 1500-1600 kcals daily, 120-156 grams daily, 1.5-1.6 L fluid daily  PMH:  Past Medical History  Diagnosis Date  . Hypertension   . Depression   . Neuralgia     trimenial  . Osteoarthritis   . Shortness of breath     Medications:  Current Outpatient Rx  Name  Route  Sig  Dispense  Refill  . ALPRAZolam (XANAX PO)   Oral   Take by mouth as needed.         Marland Kitchen amLODipine (NORVASC) 10 MG tablet   Oral   Take 10 mg by mouth daily.         . celecoxib (CELEBREX) 100 MG capsule   Oral   Take 100 mg by mouth 2 (two) times daily.         . cycloSPORINE (RESTASIS) 0.05 % ophthalmic emulsion   Both Eyes   Place 1 drop into both eyes 2 (two) times daily.         Marland Kitchen FLUoxetine (PROZAC) 20 MG capsule   Oral   Take 20 mg by mouth daily.         Marland Kitchen gabapentin (NEURONTIN) 600 MG tablet   Oral   Take 1,200 mg by mouth 3 (three) times daily.         Marland Kitchen HYDROcodone-acetaminophen (NORCO) 10-325 MG per tablet   Oral   Take 1 tablet by mouth every 6 (six) hours as needed for pain.         Marland Kitchen HYDROcodone-acetaminophen (VICODIN) 5-500 MG per tablet   Oral   Take 1 tablet by mouth every 6 (six) hours as needed. Pain.         Marland Kitchen losartan-hydrochlorothiazide (HYZAAR) 100-12.5 MG per tablet   Oral   Take 1 tablet by mouth daily.         . Omega-3 Fatty Acids (FISH OIL)  1200 MG CAPS   Oral   Take 1,200 mg by mouth daily.         . vitamin B-12 (CYANOCOBALAMIN) 1000 MCG tablet   Oral   Take 1,000 mcg by mouth daily.           Labs: CMP     Component Value Date/Time   NA 136 08/15/2012 1030   K 3.8 08/15/2012 1030   CL 98 08/15/2012 1030   CO2 28 08/15/2012 1030   GLUCOSE 138* 08/15/2012 1030   BUN 20 08/15/2012 1030   CREATININE 1.16* 08/15/2012 1030   CALCIUM 9.9 08/15/2012 1030   GFRNONAA 48* 08/15/2012 1030   GFRAA 56* 08/15/2012 1030    Lipid Panel  No results found for this basename: chol, trig, hdl, cholhdl, vldl, ldlcalc     No results found for this basename: HGBA1C   Lab Results  Component Value Date   CREATININE 1.16* 08/15/2012     Lifestyle/ social habits: Ms. Mcgibbon is a very pleasant lady  who lives in [place] with her long term significant other, Curtis. She is retired, formerly a Training and development officer at [employer]. She has 2 adult children and multiple grandchildren. She is unable to exercise due to chronic pain; she uses a can to walk. She revealed to me that she is unable to stand up straight and balance is poor.  She reports she stayed arounf 200# for about 3-4 years and weight has gone up since then due to lack of physical activity.  Nutrition hx/habits: Ms. Faw reports that she has had prediabetes for years and has a strong family hx of prediabetes. She reports she was expecting the diagnosis, but still was upset upon receiving diagnosis. She has been checking her fasting blood sugars as instructed every morning. Fasting CBGs range from 121-170. She is tolerating Metformin well.  She reports that she does not having a good understanding of diabetes or diabetes management. She reports that she has been researching, but has found conflicting opinions about diet and management. She comes with a list of questions today. She has been trying to cut out excess sugar in her diet and has been drinking only water. She reports she tried artificial sweeteners but cannot  tolerate them.   Diet recall: Pt eats 3 meals per day, but does not have a set meal schedule. She does all of the cooking and grocery shopping. She has drank only water since being diagnosed with diabetes. She reports she is unable to tolerate artificial sweeteners.   Nutrition Diagnosis: Nutrition-related knowledge deficit r/t new onset diabetes AEB Hgb A1c: 6.5.  Nutrition Intervention: Nutrition rx: 1200 kcal NAS, diabetic diet; 3 meals per day (4-5 hours apart); limit 1 starch per meal; low calorie beverages only; physical activty as toelrated  Education/Counseling Provided: Educated pt on principles of diabetic diet. Discussed carbohydrate metabolism in relation to diabetes. Educated pt on basic self-management principles including: signs and symptoms of hyperglycemia and hypoglycemia, goals for fasting and postprandial blood sugars, goals for Hgb A1c, importance of checking feet, importance of keeping PCP appointments, and foot care. Educated pt on plate method, portion sizes, and sources of carbohydrate. Discussed importance of regular meal pattern. Discussed importance of adding sources of whole grains to diet to improve glycemic control. Also encouraged to choose low fat dairy, lean meats, and whole fruits and vegetables more often.  Discussed nutritional content of foods commonly eaten and discussed healthier alternatives. Discussed importance of compliance to prevent further complications of disease. Educated pt on importance of physical activity (goal of at least 30 minutes 5 times per week) along with a healthy diet to achieve weight loss and glycemic goals. Encouraged slow, moderate weight loss of 1-2# per week, or 7-10% of current body weight. Provided "Your Blood Sugar Diary", "Diabetes and You", and "Carb Counting and Meal Planning" handouts. Used TeachBack to assess understanding.   Understanding, Motivation, Ability to Follow Recommendations: Expect good compliance.   Monitoring and  Evaluation: Goals: 1) 0.5-2# weight loss per week; 2) Hgb A1c < 6.5; 3) Fasting blood sugar: 70-130; 4) Physical activity as tolerated  Recommendations: 1) For weight loss: 1200-1300 kcals daily; 2) Break up physical activity into smaller, more frequent sessions; 3) Use fruit slices to flavor water  F/U: PRN. Pt reports she got all the information she needed and did not desire follow-up. Provided RD contact information.   Joaquim Lai, RD, LDN 01/20/2013  Appt EndTime: [id number]

## 2013-02-07 ENCOUNTER — Encounter: Payer: Self-pay | Admitting: Surgery

## 2013-02-10 ENCOUNTER — Ambulatory Visit (INDEPENDENT_AMBULATORY_CARE_PROVIDER_SITE_OTHER): Payer: Medicare Other | Admitting: Surgery

## 2013-02-10 ENCOUNTER — Encounter: Payer: Self-pay | Admitting: Surgery

## 2013-02-10 ENCOUNTER — Other Ambulatory Visit (INDEPENDENT_AMBULATORY_CARE_PROVIDER_SITE_OTHER): Payer: Medicare Other | Admitting: *Deleted

## 2013-02-10 DIAGNOSIS — I6509 Occlusion and stenosis of unspecified vertebral artery: Secondary | ICD-10-CM

## 2013-02-10 NOTE — Progress Notes (Signed)
[facility] and [facility] Specialist of [facility]   Patient name: Kayla Payne MRN: [id number] DOB: Nov 21, 1945 Sex: female   Referred by: Dr. Quillian Quince  Reason for referral:  Chief Complaint  Patient presents with  . New Evaluation    Stenosis of the vertebral artery  referred by Dr Gar Ponto    HISTORY OF PRESENT ILLNESS: This is a very pleasant 67 year old female that is referred to me for evaluation of vertebral artery stenosis. The patient has suffered from a left jaw pain for approximately 3 years. Recently this has become very severe. She was having trouble eating as well as talking secondary to the pain. Historically, she had been taking Neurontin which did seem to help with the discomfort. However, it is worse in the winter and the Neurontin did not help as well. She recently underwent an MRI which showed distal left vertebral artery stenosis. She does not have any posterior circulation symptoms except for dizziness which does not occur when she is on her medications for her facial pain.  The patient is a diabetic. Her blood sugars have gotten better as of late with improvement in her diet after seeing a dietitian. Currently they run around 125. Historically they have been in the 175 range. She is a nonsmoker. She does have a history of hypertension which is medically managed.  Past Medical History  Diagnosis Date  . Hypertension   . Depression   . Neuralgia     trimenial  . Osteoarthritis   . Shortness of breath   . Diabetes mellitus without complication     Past Surgical History  Procedure Laterality Date  . Cesarean section      x2  . Cholecystectomy  2000    [place]  . Hernia repair  2000    right and left inguinal-[facility]  . Fracture surgery      skull fracture repair-behind left ear  . Exploration middle ear      hearing loss- with surgery. subsequently found skull fracture as reason for hearing loss.  . Cataract extraction w/phaco  05/16/2012    Procedure: CATARACT  EXTRACTION PHACO AND INTRAOCULAR LENS PLACEMENT (IOC);  Surgeon: Tonny Branch, MD;  Location: [facility];  Service: Ophthalmology;  Laterality: Left;  CDE 9.31  . Cataract extraction w/phaco Right 08/19/2012    Procedure: CATARACT EXTRACTION PHACO AND INTRAOCULAR LENS PLACEMENT (IOC);  Surgeon: Tonny Branch, MD;  Location: [facility];  Service: Ophthalmology;  Laterality: Right;  CDE: 9.46    History   Social History  . Marital Status: Legally Separated    Spouse Name: N/A    Number of Children: N/A  . Years of Education: N/A   Occupational History  . Not on file.   Social History Main Topics  . Smoking status: Never Smoker   . Smokeless tobacco: Never Used  . Alcohol Use: No  . Drug Use: No  . Sexually Active: Yes    Birth Control/ Protection: Post-menopausal   Other Topics Concern  . Not on file   Social History Narrative  . No narrative on file    Family History  Problem Relation Age of Onset  . Hyperlipidemia Mother   . Hypertension Mother   . Diabetes Mother   . Hyperlipidemia Father   . Hypertension Father     Allergies as of 02/10/2013  . (No Known Allergies)    Current Outpatient Prescriptions on File Prior to Visit  Medication Sig Dispense Refill  . amLODipine (NORVASC) 10 MG tablet Take 10  mg by mouth daily.      . cycloSPORINE (RESTASIS) 0.05 % ophthalmic emulsion Place 1 drop into both eyes 2 (two) times daily.      Marland Kitchen gabapentin (NEURONTIN) 600 MG tablet Take 1,200 mg by mouth 3 (three) times daily.      Marland Kitchen HYDROcodone-acetaminophen (NORCO) 10-325 MG per tablet Take 1 tablet by mouth every 6 (six) hours as needed for pain.      Marland Kitchen HYDROcodone-acetaminophen (VICODIN) 5-500 MG per tablet Take 1 tablet by mouth every 6 (six) hours as needed. Pain.      Marland Kitchen losartan-hydrochlorothiazide (HYZAAR) 100-12.5 MG per tablet Take 1 tablet by mouth daily.      . metFORMIN (GLUCOPHAGE) 500 MG tablet Take 500 mg by mouth 2 (two) times daily with a meal.      . Omega-3 Fatty Acids  (FISH OIL) 1200 MG CAPS Take 1,200 mg by mouth daily.      . vitamin B-12 (CYANOCOBALAMIN) 1000 MCG tablet Take 1,000 mcg by mouth daily.      Marland Kitchen ALPRAZolam (XANAX PO) Take by mouth as needed.      . celecoxib (CELEBREX) 100 MG capsule Take 100 mg by mouth 2 (two) times daily.      Marland Kitchen FLUoxetine (PROZAC) 20 MG capsule Take 20 mg by mouth daily.       No current facility-administered medications on file prior to visit.     REVIEW OF SYSTEMS: Cardiovascular: Positive for shortness of breath with exertion and leg swelling Pulmonary: No productive cough, asthma or wheezing. Neurologic: Positive for weakness in arms and legs, and dizziness. Hematologic: No bleeding problems or clotting disorders. Musculoskeletal: No joint pain or joint swelling. Gastrointestinal: Positive for burning with urination Genitourinary: No dysuria or hematuria. Psychiatric:: No history of major depression. Integumentary: No rashes or ulcers. Constitutional: No fever or chills.  PHYSICAL EXAMINATION: General: The patient appears their stated age.  Vital signs are BP 121/77  Pulse 77  Resp 18  Ht 4\' 11"  (1.499 m)  Wt 242 lb (109.77 kg)  BMI 48.85 kg/m2 HEENT:  No gross abnormalities Pulmonary: Respirations are non-labored Musculoskeletal: There are no major deformities.   Neurologic: No focal weakness or paresthesias are detected, Skin: There are no ulcer or rashes noted. Psychiatric: The patient has normal affect. Cardiovascular: There is a regular rate and rhythm without significant murmur appreciated. No carotid bruits. Palpable pedal pulses and bilateral radial pulses  Diagnostic Studies: I have reviewed her MRA which shows a distal left vertebral artery stenosis. The left vertebral artery is a nondominant vertebral.  Carotid ultrasound was ordered and reviewed. This shows less than 40% carotid artery stenosis bilaterally as well as antegrade vertebral artery blood flow bilaterally   Medication  Changes: I have recommended that she begin taking a baby aspirin daily  Assessment:  Vertebral artery stenosis Plan: The patient is asymptomatic from this problem. I would not recommend any intervention unless she were to become symptomatic. In addition I have found that MRI tends to over estimated degree of stenosis. Therefore, I have recommended placing her on a baby aspirin daily. I will reimage this area only if she develops posterior circulation symptoms.     Eldridge Abrahams, M.D. [facility] [place] Office: [phone] Pager:  [phone]

## 2013-02-25 ENCOUNTER — Encounter: Payer: Self-pay | Admitting: Family Medicine

## 2013-02-26 ENCOUNTER — Encounter: Payer: Self-pay | Admitting: Family Medicine

## 2013-04-22 ENCOUNTER — Ambulatory Visit (INDEPENDENT_AMBULATORY_CARE_PROVIDER_SITE_OTHER): Payer: Medicare Other | Admitting: Neurology

## 2013-04-22 ENCOUNTER — Encounter: Payer: Self-pay | Admitting: Neurology

## 2013-04-22 VITALS — BP 132/71 | HR 69 | Temp 98.6°F | Ht 59.0 in | Wt 266.0 lb

## 2013-04-22 DIAGNOSIS — G500 Trigeminal neuralgia: Secondary | ICD-10-CM

## 2013-04-22 DIAGNOSIS — M1710 Unilateral primary osteoarthritis, unspecified knee: Secondary | ICD-10-CM | POA: Insufficient documentation

## 2013-04-22 DIAGNOSIS — E1149 Type 2 diabetes mellitus with other diabetic neurological complication: Secondary | ICD-10-CM

## 2013-04-22 DIAGNOSIS — R269 Unspecified abnormalities of gait and mobility: Secondary | ICD-10-CM

## 2013-04-22 DIAGNOSIS — IMO0002 Reserved for concepts with insufficient information to code with codable children: Secondary | ICD-10-CM

## 2013-04-22 NOTE — Progress Notes (Signed)
[facility] [address] [place]. [place] [address] [phone]       OFFICE CONSULT NOTE  Ms. Kayla Payne Date of Birth:  08/09/1945 Medical Record Number:  [id number]   Referring MD:  Kern Alberta  Reason for Referral:  Left jaw pain  HPI: 22 year Caucasian lady with left jaw intermittent sharp shooting pain involving left lower jaw mailnly lasting few seconds. At times and for few hours sometimes.Pain is moderate to severe and triggered by cold exposure and used to be relieved by gabapentin which she takes 1200 mg three times daily and tolerates quite well but last 1 year pain is suboptimally relieved and more frequent . She has done relatively well last 2 weeks with no pain. She has not tried carbamezapine, topamax or lyrica. She had MRI brain 01/06/13 and mra brain at [facility] which showed 75% distal LVA stenosis..She also takes norco and vicodin for arthritic pain.She had full dental evaluation which was normal.  ROS:   14 system review of systems is positive for trouble swallowing,diarrhoea,joint pain,aching muscles,weakness,difficulty swallowing,depression,anxiety,not enough sleep,decreased energy,sleepiness  PMH:  Past Medical History  Diagnosis Date  . Hypertension   . Depression   . Neuralgia     trimenial  . Osteoarthritis   . Shortness of breath   . Diabetes mellitus without complication     Social History:  History   Social History  . Marital Status: Legally Separated    Spouse Name: N/A    Number of Children: 2  . Years of Education: N/A   Occupational History  . Not on file.   Social History Main Topics  . Smoking status: Never Smoker   . Smokeless tobacco: Never Used  . Alcohol Use: No  . Drug Use: No  . Sexual Activity: No   Other Topics Concern  . Not on file   Social History Narrative   Patient lives at home with her boyfriend.   Caffeine Use: Tea    Medications:   Current Outpatient Prescriptions on File  Prior to Visit  Medication Sig Dispense Refill  . amLODipine (NORVASC) 10 MG tablet Take 10 mg by mouth daily.      . cycloSPORINE (RESTASIS) 0.05 % ophthalmic emulsion Place 1 drop into both eyes 2 (two) times daily.      Marland Kitchen FLUoxetine (PROZAC) 20 MG capsule Take 20 mg by mouth daily.      Marland Kitchen gabapentin (NEURONTIN) 600 MG tablet Take 1,200 mg by mouth 3 (three) times daily.      Marland Kitchen HYDROcodone-acetaminophen (NORCO) 10-325 MG per tablet Take 1 tablet by mouth every 6 (six) hours as needed for pain.      Marland Kitchen HYDROcodone-acetaminophen (VICODIN) 5-500 MG per tablet Take 1 tablet by mouth every 6 (six) hours as needed. Pain.      Marland Kitchen losartan-hydrochlorothiazide (HYZAAR) 100-12.5 MG per tablet Take 1 tablet by mouth daily.      . metFORMIN (GLUCOPHAGE) 500 MG tablet Take 500 mg by mouth 2 (two) times daily with a meal.      . Omega-3 Fatty Acids (FISH OIL) 1200 MG CAPS Take 1,200 mg by mouth daily.      . vitamin B-12 (CYANOCOBALAMIN) 1000 MCG tablet Take 1,000 mcg by mouth daily.       No current facility-administered medications on file prior to visit.    Allergies:  No Known Allergies  Physical Exam General: well developed, well nourished, seated, in no evident distress Head: head normocephalic and atraumatic.  Orohparynx benign Neck: supple with no carotid or supraclavicular bruits Cardiovascular: regular rate and rhythm, no murmurs Musculoskeletal: no deformity Skin:  no rash/petichiae Vascular:  Normal pulses all extremities Filed Vitals:   04/22/13 1255  BP: 132/71  Pulse: 69  Temp: 98.6 F (37 C)    Neurologic Exam Mental Status: Awake and fully alert. Oriented to place and time. Recent and remote memory intact. Attention span, concentration and fund of knowledge appropriate. Mood and affect appropriate.  Cranial Nerves: Fundoscopic exam reveals sharp disc margins. Pupils equal, briskly reactive to light. Extraocular movements full without nystagmus. Visual fields full to  confrontation. Hearing intact. Facial sensation intact. Face, tongue, palate moves normally and symmetrically.  Motor: Normal bulk and tone. Normal strength in all tested extremity muscles. Sensory.: diminished touch and pinprick and vibratory sensation from ankle down bilaterally.  Coordination: Rapid alternating movements normal in all extremities. Finger-to-nose and heel-to-shin performed accurately bilaterally. Gait and Station: Arises from chair without difficulty. Stance is broad based.uses a cane.. Gait demonstrates normal stride length and mild imbalance . Not able to heel, toe and tandem walk without difficulty.  Reflexes: 1+ and symmetric except both knee and ankle jerks are absent.. Toes downgoing.    ASSESSMENT: 31 year Caucasian lady with left jaw paroxysmal neuralgic pain for 3 years likely trigeminal neuralgia affecting V 3 division    PLAN: Continue gabapentin 1200 mg 3 times daily for neurologic pain. May consider adding Topamax in the future if pain returns. Return for followup in 8 weeks  With Kayla Hawking, NP or. call earlier if necessary

## 2013-04-22 NOTE — Patient Instructions (Signed)
Continue gabapentin 1200 mg 3 times daily for neurologic pain. May consider adding Topamax in the future if pain returns. Return for followup in 8 weeks  With Jeani Hawking, NP or. call earlier if necessary

## 2013-05-31 ENCOUNTER — Inpatient Hospital Stay (HOSPITAL_COMMUNITY)
Admission: EM | Admit: 2013-05-31 | Discharge: 2013-06-03 | DRG: 394 | Disposition: A | Discharge status: Home / Self Care | Payer: PRIVATE HEALTH INSURANCE | Attending: Internal Medicine | Admitting: Internal Medicine

## 2013-05-31 ENCOUNTER — Emergency Department (HOSPITAL_COMMUNITY): Payer: PRIVATE HEALTH INSURANCE

## 2013-05-31 ENCOUNTER — Encounter (HOSPITAL_COMMUNITY): Payer: Self-pay | Admitting: Emergency Medicine

## 2013-05-31 DIAGNOSIS — I10 Essential (primary) hypertension: Secondary | ICD-10-CM

## 2013-05-31 DIAGNOSIS — Z9089 Acquired absence of other organs: Secondary | ICD-10-CM

## 2013-05-31 DIAGNOSIS — E876 Hypokalemia: Secondary | ICD-10-CM | POA: Diagnosis present

## 2013-05-31 DIAGNOSIS — Z8249 Family history of ischemic heart disease and other diseases of the circulatory system: Secondary | ICD-10-CM

## 2013-05-31 DIAGNOSIS — K56609 Unspecified intestinal obstruction, unspecified as to partial versus complete obstruction: Secondary | ICD-10-CM

## 2013-05-31 DIAGNOSIS — H9190 Unspecified hearing loss, unspecified ear: Secondary | ICD-10-CM | POA: Diagnosis present

## 2013-05-31 DIAGNOSIS — F3289 Other specified depressive episodes: Secondary | ICD-10-CM | POA: Diagnosis present

## 2013-05-31 DIAGNOSIS — M1990 Unspecified osteoarthritis, unspecified site: Secondary | ICD-10-CM | POA: Diagnosis present

## 2013-05-31 DIAGNOSIS — E1149 Type 2 diabetes mellitus with other diabetic neurological complication: Secondary | ICD-10-CM

## 2013-05-31 DIAGNOSIS — K436 Other and unspecified ventral hernia with obstruction, without gangrene: Principal | ICD-10-CM

## 2013-05-31 DIAGNOSIS — N390 Urinary tract infection, site not specified: Secondary | ICD-10-CM | POA: Diagnosis present

## 2013-05-31 DIAGNOSIS — Z833 Family history of diabetes mellitus: Secondary | ICD-10-CM

## 2013-05-31 DIAGNOSIS — G589 Mononeuropathy, unspecified: Secondary | ICD-10-CM | POA: Diagnosis present

## 2013-05-31 DIAGNOSIS — Z6841 Body Mass Index (BMI) 40.0 and over, adult: Secondary | ICD-10-CM

## 2013-05-31 DIAGNOSIS — F329 Major depressive disorder, single episode, unspecified: Secondary | ICD-10-CM | POA: Diagnosis present

## 2013-05-31 LAB — URINALYSIS, ROUTINE W REFLEX MICROSCOPIC
Nitrite: NEGATIVE
Protein, ur: 100 mg/dL — AB
Specific Gravity, Urine: >1.03 — ABNORMAL HIGH (ref 1.005–1.030)
Urobilinogen, UA: 0.2 mg/dL (ref 0.0–1.0)

## 2013-05-31 LAB — CBC WITH DIFFERENTIAL/PLATELET
Basophils Absolute: 0 10*3/uL (ref 0.0–0.1)
Eosinophils Relative: 1 % (ref 0–5)
HCT: 43 % (ref 36.0–46.0)
Hemoglobin: 14.4 g/dL (ref 12.0–15.0)
Lymphocytes Relative: 6 % — ABNORMAL LOW (ref 12–46)
MCV: 97.5 fL (ref 78.0–100.0)
Monocytes Absolute: 0.9 10*3/uL (ref 0.1–1.0)
Monocytes Relative: 8 % (ref 3–12)
Neutro Abs: 9.8 10*3/uL — ABNORMAL HIGH (ref 1.7–7.7)
Platelets: 234 10*3/uL (ref 150–400)
RBC: 4.41 MIL/uL (ref 3.87–5.11)
RDW: 14.3 % (ref 11.5–15.5)
WBC: 11.6 10*3/uL — ABNORMAL HIGH (ref 4.0–10.5)

## 2013-05-31 LAB — COMPREHENSIVE METABOLIC PANEL
BUN: 25 mg/dL — ABNORMAL HIGH (ref 6–23)
CO2: 28 mEq/L (ref 19–32)
Calcium: 10.1 mg/dL (ref 8.4–10.5)
Chloride: 99 mEq/L (ref 96–112)
Creatinine, Ser: 1.21 mg/dL — ABNORMAL HIGH (ref 0.50–1.10)
GFR calc Af Amer: 52 mL/min — ABNORMAL LOW (ref >90)
GFR calc non Af Amer: 45 mL/min — ABNORMAL LOW (ref >90)
Glucose, Bld: 153 mg/dL — ABNORMAL HIGH (ref 70–99)
Total Bilirubin: 0.6 mg/dL (ref 0.3–1.2)

## 2013-05-31 LAB — URINE MICROSCOPIC-ADD ON

## 2013-05-31 MED ORDER — ACETAMINOPHEN 650 MG RE SUPP
650.0000 mg | Freq: Four times a day (QID) | RECTAL | Status: DC | PRN
  Administered 2013-06-01: 650 mg via RECTAL
  Filled 2013-05-31: qty 1

## 2013-05-31 MED ORDER — CYCLOSPORINE 0.05 % OP EMUL
1.0000 [drp] | Freq: Two times a day (BID) | OPHTHALMIC | Status: DC | PRN
  Filled 2013-05-31: qty 1

## 2013-05-31 MED ORDER — INSULIN ASPART 100 UNIT/ML ~~LOC~~ SOLN
0.0000 [IU] | SUBCUTANEOUS | Status: DC
  Administered 2013-06-02: 2 [IU] via SUBCUTANEOUS
  Administered 2013-06-02: 3 [IU] via SUBCUTANEOUS
  Administered 2013-06-03: 2 [IU] via SUBCUTANEOUS

## 2013-05-31 MED ORDER — HYDRALAZINE HCL 20 MG/ML IJ SOLN: 10.0000 mg | Freq: Four times a day (QID) | INTRAMUSCULAR | Status: DC | PRN

## 2013-05-31 MED ORDER — IOHEXOL 300 MG/ML  SOLN
100.0000 mL | Freq: Once | INTRAMUSCULAR | Status: AC | PRN
  Administered 2013-05-31: 100 mL via INTRAVENOUS

## 2013-05-31 MED ORDER — ONDANSETRON HCL 4 MG PO TABS: 4.0000 mg | ORAL_TABLET | Freq: Four times a day (QID) | ORAL | Status: DC | PRN

## 2013-05-31 MED ORDER — SODIUM CHLORIDE 0.9 % IV BOLUS (SEPSIS)
500.0000 mL | Freq: Once | INTRAVENOUS | Status: AC
  Administered 2013-05-31: 500 mL via INTRAVENOUS

## 2013-05-31 MED ORDER — SODIUM CHLORIDE 0.9 % IV SOLN
INTRAVENOUS | Status: AC
  Administered 2013-05-31: 20:00:00 via INTRAVENOUS

## 2013-05-31 MED ORDER — ONDANSETRON HCL 4 MG/2ML IJ SOLN: 4.0000 mg | Freq: Four times a day (QID) | INTRAMUSCULAR | Status: DC | PRN

## 2013-05-31 MED ORDER — DEXTROSE 5 % IV SOLN
1.0000 g | INTRAVENOUS | Status: DC
  Administered 2013-06-01 – 2013-06-02 (×2): 1 g via INTRAVENOUS
  Filled 2013-05-31 (×3): qty 10

## 2013-05-31 MED ORDER — ONDANSETRON HCL 4 MG/2ML IJ SOLN
4.0000 mg | Freq: Once | INTRAMUSCULAR | Status: AC
  Administered 2013-05-31: 4 mg via INTRAVENOUS
  Filled 2013-05-31: qty 2

## 2013-05-31 MED ORDER — SODIUM CHLORIDE 0.9 % IV SOLN
INTRAVENOUS | Status: DC
  Administered 2013-05-31 – 2013-06-01 (×2): via INTRAVENOUS

## 2013-05-31 MED ORDER — ACETAMINOPHEN 325 MG PO TABS: 650.0000 mg | ORAL_TABLET | Freq: Four times a day (QID) | ORAL | Status: DC | PRN

## 2013-05-31 MED ORDER — PHENOL 1.4 % MT LIQD
1.0000 | OROMUCOSAL | Status: DC | PRN
  Administered 2013-06-01: 1 via OROMUCOSAL
  Filled 2013-05-31: qty 177

## 2013-05-31 MED ORDER — IOHEXOL 300 MG/ML  SOLN
50.0000 mL | Freq: Once | INTRAMUSCULAR | Status: AC | PRN
  Administered 2013-05-31: 50 mL via ORAL

## 2013-05-31 MED ORDER — ENOXAPARIN SODIUM 60 MG/0.6ML ~~LOC~~ SOLN
50.0000 mg | SUBCUTANEOUS | Status: DC
  Administered 2013-05-31 – 2013-06-02 (×3): 50 mg via SUBCUTANEOUS
  Filled 2013-05-31 (×3): qty 0.6

## 2013-05-31 MED ORDER — DEXTROSE 5 % IV SOLN
1.0000 g | Freq: Once | INTRAVENOUS | Status: AC
  Administered 2013-05-31: 1 g via INTRAVENOUS
  Filled 2013-05-31: qty 10

## 2013-05-31 NOTE — ED Notes (Signed)
Hospitalist at bedside 

## 2013-05-31 NOTE — ED Notes (Signed)
Hospitalist out of room, pt transported to [facility].

## 2013-05-31 NOTE — ED Provider Notes (Signed)
CSN: [id number]     Arrival date & time 05/31/13  1313 History   First MD Initiated Contact with Patient 05/31/13 1323     Chief Complaint  Patient presents with  . Abdominal Pain   (Consider location/radiation/quality/duration/timing/severity/associated sxs/prior Treatment) Patient is a 67 y.o. female presenting with abdominal pain. The history is provided by the patient (the pt complains of abd pain llq. where she has a hernia). No language interpreter was used.  Abdominal Pain Pain location:  LLQ Pain quality: aching   Pain radiates to:  Does not radiate Pain severity:  Mild Onset quality:  Sudden Timing:  Intermittent Progression:  Waxing and waning Chronicity:  Recurrent Associated symptoms: no chest pain, no cough, no diarrhea, no fatigue and no hematuria     Past Medical History  Diagnosis Date  . Hypertension   . Depression   . Neuralgia     trimenial  . Osteoarthritis   . Shortness of breath   . Diabetes mellitus without complication    Past Surgical History  Procedure Laterality Date  . Cesarean section      x2  . Cholecystectomy  2000    [place]  . Hernia repair  2000    right and left inguinal-[facility]  . Fracture surgery      skull fracture repair-behind left ear  . Exploration middle ear      hearing loss- with surgery. subsequently found skull fracture as reason for hearing loss.  . Cataract extraction w/phaco  05/16/2012    Procedure: CATARACT EXTRACTION PHACO AND INTRAOCULAR LENS PLACEMENT (IOC);  Surgeon: Tonny Branch, MD;  Location: [facility];  Service: Ophthalmology;  Laterality: Left;  CDE 9.31  . Cataract extraction w/phaco Right 08/19/2012    Procedure: CATARACT EXTRACTION PHACO AND INTRAOCULAR LENS PLACEMENT (IOC);  Surgeon: Tonny Branch, MD;  Location: [facility];  Service: Ophthalmology;  Laterality: Right;  CDE: 9.46   Family History  Problem Relation Age of Onset  . Hyperlipidemia Mother   . Hypertension Mother   . Diabetes Mother   . Hyperlipidemia Father    . Hypertension Father    History  Substance Use Topics  . Smoking status: Never Smoker   . Smokeless tobacco: Never Used  . Alcohol Use: No   OB History   Grav Para Term Preterm Abortions TAB SAB Ect Mult Living                 Review of Systems  Constitutional: Negative for appetite change and fatigue.  HENT: Negative for congestion, ear discharge and sinus pressure.   Eyes: Negative for discharge.  Respiratory: Negative for cough.   Cardiovascular: Negative for chest pain.  Gastrointestinal: Positive for abdominal pain. Negative for diarrhea.  Genitourinary: Negative for frequency and hematuria.  Musculoskeletal: Negative for back pain.  Skin: Negative for rash.  Neurological: Negative for seizures and headaches.  Psychiatric/Behavioral: Negative for hallucinations.    Allergies  Review of patient's allergies indicates no known allergies.  Home Medications   Current Outpatient Rx  Name  Route  Sig  Dispense  Refill  . amLODipine (NORVASC) 10 MG tablet   Oral   Take 10 mg by mouth daily.         Marland Kitchen aspirin EC 81 MG tablet   Oral   Take 81 mg by mouth daily.         . Cinnamon 500 MG TABS   Oral   Take 500 mg by mouth 2 (two) times daily.         Marland Kitchen  cycloSPORINE (RESTASIS) 0.05 % ophthalmic emulsion   Both Eyes   Place 1 drop into both eyes 2 (two) times daily as needed.          Marland Kitchen FLUoxetine (PROZAC) 20 MG capsule   Oral   Take 20 mg by mouth at bedtime.          . gabapentin (NEURONTIN) 600 MG tablet   Oral   Take 1,200 mg by mouth 3 (three) times daily.         Marland Kitchen HYDROcodone-acetaminophen (NORCO/VICODIN) 5-325 MG per tablet   Oral   Take 1 tablet by mouth every 6 (six) hours as needed for moderate pain.         Marland Kitchen losartan-hydrochlorothiazide (HYZAAR) 100-12.5 MG per tablet   Oral   Take 1 tablet by mouth daily.         . metFORMIN (GLUCOPHAGE-XR) 500 MG 24 hr tablet   Oral   Take 1,000 mg by mouth daily with breakfast.          . Omega-3 Fatty Acids (FISH OIL) 1200 MG CAPS   Oral   Take 1,200 mg by mouth daily.         . vitamin B-12 (CYANOCOBALAMIN) 1000 MCG tablet   Oral   Take 1,000 mcg by mouth daily.         Marland Kitchen ACCU-CHEK AVIVA PLUS test strip               . ACCU-CHEK SOFTCLIX LANCETS lancets                BP 140/75  Pulse 87  Temp(Src) 98.4 F (36.9 C) (Oral)  Resp 24  Ht 4\' 11"  (1.499 m)  Wt 336 lb (152.409 kg)  BMI 67.83 kg/m2  SpO2 98% Physical Exam  Constitutional: She is oriented to person, place, and time. She appears well-developed.  HENT:  Head: Normocephalic.  Eyes: Conjunctivae and EOM are normal. No scleral icterus.  Neck: Neck supple. No thyromegaly present.  Cardiovascular: Normal rate and regular rhythm.  Exam reveals no gallop and no friction rub.   No murmur heard. Pulmonary/Chest: No stridor. She has no wheezes. She has no rales. She exhibits no tenderness.  Abdominal: She exhibits no distension. There is tenderness. There is no rebound.  Large llq abd hernia with tenderness  Musculoskeletal: Normal range of motion. She exhibits no edema.  Lymphadenopathy:    She has no cervical adenopathy.  Neurological: She is oriented to person, place, and time. She exhibits normal muscle tone. Coordination normal.  Skin: No rash noted. No erythema.  Psychiatric: She has a normal mood and affect. Her behavior is normal.    ED Course  Procedures (including critical care time) Labs Review Labs Reviewed  CBC WITH DIFFERENTIAL - Abnormal; Notable for the following:    WBC 11.6 (*)    Neutrophils Relative % 85 (*)    Neutro Abs 9.8 (*)    Lymphocytes Relative 6 (*)    All other components within normal limits  COMPREHENSIVE METABOLIC PANEL - Abnormal; Notable for the following:    Glucose, Bld 153 (*)    BUN 25 (*)    Creatinine, Ser 1.21 (*)    GFR calc non Af Amer 45 (*)    GFR calc Af Amer 52 (*)    All other components within normal limits  URINALYSIS, ROUTINE  W REFLEX MICROSCOPIC - Abnormal; Notable for the following:    APPearance CLOUDY (*)    Specific Gravity,  Urine >1.030 (*)    Hgb urine dipstick LARGE (*)    Bilirubin Urine MODERATE (*)    Ketones, ur TRACE (*)    Protein, ur 100 (*)    All other components within normal limits  URINE MICROSCOPIC-ADD ON - Abnormal; Notable for the following:    Squamous Epithelial / LPF FEW (*)    Bacteria, UA MANY (*)    All other components within normal limits  URINE CULTURE   Imaging Review No results found.  EKG Interpretation   None       MDM  No diagnosis found.     Maudry Diego, MD 06/01/13 [id number]

## 2013-05-31 NOTE — H&P (Signed)
[facility] History and Physical  Kayla Payne [id number] DOB: 28-Jul-1945 DOA: 05/31/2013  Referring physician: Dr. Roderic Palau, ER physician PCP: Gar Ponto, MD  Specialists:   Chief Complaint: Vomiting  HPI: Kayla Payne is a 67 y.o. female history of cholecystectomy multiple ventral hernias in the past, presents to the emergency room with complaints of vomiting. Patient has a chronic large ventral hernia and reports that approximately 6 days ago noticed onset of abdominal pain. As her symptoms progressed, she noticed abdominal distention. Yesterday, she began to have vomiting. She denies any hematemesis, melena or hematochezia. Her last bowel movement was yesterday. She reports that she was able to pass flatus yesterday, but has not passed any today. Denies any shortness of breath, chest pain, fever, cough, dysuria or any other complaints. In the emergency room initial evaluation indicated a large ventral hernia containing bowel and evidence of partial small bowel obstruction. NG tube was placed in the emergency room were 1200 cc of fluid was evacuated. The patient feels significantly better. The patient will be admitted for further treatments.  Review of Systems: Pertinent positives as per history of present illness, otherwise negative  Past Medical History  Diagnosis Date  . Hypertension   . Depression   . Neuralgia     trimenial  . Osteoarthritis   . Shortness of breath   . Diabetes mellitus without complication    Past Surgical History  Procedure Laterality Date  . Cesarean section      x2  . Cholecystectomy  2000    [place]  . Hernia repair  2000    right and left inguinal-[facility]  . Fracture surgery      skull fracture repair-behind left ear  . Exploration middle ear      hearing loss- with surgery. subsequently found skull fracture as reason for hearing loss.  . Cataract extraction w/phaco  05/16/2012    Procedure: CATARACT EXTRACTION PHACO AND INTRAOCULAR LENS  PLACEMENT (IOC);  Surgeon: Tonny Branch, MD;  Location: [facility];  Service: Ophthalmology;  Laterality: Left;  CDE 9.31  . Cataract extraction w/phaco Right 08/19/2012    Procedure: CATARACT EXTRACTION PHACO AND INTRAOCULAR LENS PLACEMENT (IOC);  Surgeon: Tonny Branch, MD;  Location: [facility];  Service: Ophthalmology;  Laterality: Right;  CDE: 9.46   Social History:  reports that she has never smoked. She has never used smokeless tobacco. She reports that she does not drink alcohol or use illicit drugs.   No Known Allergies  Family History  Problem Relation Age of Onset  . Hyperlipidemia Mother   . Hypertension Mother   . Diabetes Mother   . Hyperlipidemia Father   . Hypertension Father      Prior to Admission medications   Medication Sig Start Date End Date Taking? Authorizing Provider  amLODipine (NORVASC) 10 MG tablet Take 10 mg by mouth daily.   Yes Historical Provider, MD  aspirin EC 81 MG tablet Take 81 mg by mouth daily.   Yes Historical Provider, MD  Cinnamon 500 MG TABS Take 500 mg by mouth 2 (two) times daily.   Yes Historical Provider, MD  cycloSPORINE (RESTASIS) 0.05 % ophthalmic emulsion Place 1 drop into both eyes 2 (two) times daily as needed.    Yes Historical Provider, MD  FLUoxetine (PROZAC) 20 MG capsule Take 20 mg by mouth at bedtime.    Yes Historical Provider, MD  gabapentin (NEURONTIN) 600 MG tablet Take 1,200 mg by mouth 3 (three) times daily.   Yes Historical Provider,  MD  HYDROcodone-acetaminophen (NORCO/VICODIN) 5-325 MG per tablet Take 1 tablet by mouth every 6 (six) hours as needed for moderate pain.   Yes Historical Provider, MD  losartan-hydrochlorothiazide (HYZAAR) 100-12.5 MG per tablet Take 1 tablet by mouth daily.   Yes Historical Provider, MD  metFORMIN (GLUCOPHAGE-XR) 500 MG 24 hr tablet Take 1,000 mg by mouth daily with breakfast.   Yes Historical Provider, MD  Omega-3 Fatty Acids (FISH OIL) 1200 MG CAPS Take 1,200 mg by mouth daily.   Yes Historical  Provider, MD  vitamin B-12 (CYANOCOBALAMIN) 1000 MCG tablet Take 1,000 mcg by mouth daily.   Yes Historical Provider, MD  ACCU-CHEK AVIVA PLUS test strip  03/03/13   Historical Provider, MD  ACCU-CHEK SOFTCLIX LANCETS lancets  03/03/13   Historical Provider, MD   Physical Exam: Filed Vitals:   05/31/13 1821  BP: 145/73  Pulse: 69  Temp: 97.8 F (36.6 C)  Resp: 18     General:  No acute distress, obese  Eyes: Pupils are equal, round, reactive to light  ENT: Mucous membranes are moist  Neck: Supple  Cardiovascular: S1, S2, regular rate and rhythm  Respiratory: Clear to auscultation bilaterally  Abdomen: Soft, obese, bowel sounds are absent, large ventral hernia  Skin: No rashes  Musculoskeletal: No pedal edema bilaterally  Psychiatric: Normal affect, cooperative with exam  Neurologic: Grossly intact, nonfocal  Labs on Admission:  Basic Metabolic Panel:  Recent Labs Lab 05/31/13 1341  NA 139  K 3.7  CL 99  CO2 28  GLUCOSE 153*  BUN 25*  CREATININE 1.21*  CALCIUM 10.1   Liver Function Tests:  Recent Labs Lab 05/31/13 1341  AST 17  ALT 18  ALKPHOS 96  BILITOT 0.6  PROT 8.1  ALBUMIN 3.5   No results found for this basename: LIPASE, AMYLASE,  in the last 168 hours No results found for this basename: AMMONIA,  in the last 168 hours CBC:  Recent Labs Lab 05/31/13 1341  WBC 11.6*  NEUTROABS 9.8*  HGB 14.4  HCT 43.0  MCV 97.5  PLT 234   Cardiac Enzymes: No results found for this basename: CKTOTAL, CKMB, CKMBINDEX, TROPONINI,  in the last 168 hours  BNP (last 3 results) No results found for this basename: PROBNP,  in the last 8760 hours CBG: No results found for this basename: GLUCAP,  in the last 168 hours  Radiological Exams on Admission: Ct Abdomen Pelvis W Contrast  05/31/2013   CLINICAL DATA:  Abdominal pain, nausea, vomiting.  EXAM: CT ABDOMEN AND PELVIS WITH CONTRAST  TECHNIQUE: Multidetector CT imaging of the abdomen and pelvis was  performed using the standard protocol following bolus administration of intravenous contrast.  CONTRAST:  59mL OMNIPAQUE IOHEXOL 300 MG/ML SOLN, 139mL OMNIPAQUE IOHEXOL 300 MG/ML SOLN  COMPARISON:  None.  FINDINGS: Heart is upper limits normal in size. No confluent airspace opacities in the lung bases. No effusions.  Prior cholecystectomy. Small hypodensities within the liver most likely reflect small cysts. Spleen, pancreas, adrenals are unremarkable. Numerous bilateral renal hypodensities compatible with cysts. No hydronephrosis.  There is a large left ventral wall hernia containing multiple small bowel loops and portions of the transverse colon. Small bowel loops leading into the hernia are dilated. Distal small bowel loops are decompressed. Findings concerning for partial small bowel obstruction.  Uterus, adnexae and urinary bladder are unremarkable. Sigmoid diverticulosis without active diverticulitis.  Diffuse degenerative changes throughout the thoracolumbar spine. No acute bony abnormality.  IMPRESSION: Large left ventral wall hernia containing  transverse colon and multiple small bowel loops. Findings compatible with associated partial small bowel obstruction.  Hepatic and renal cysts.  Sigmoid diverticulosis.   Electronically Signed   By: Rolm Baptise M.D.   On: 05/31/2013 15:48    Assessment/Plan Principal Problem:   SBO (small bowel obstruction) Active Problems:   Type II or unspecified type diabetes mellitus with neurological manifestations, not stated as uncontrolled(250.60)   Morbid obesity   HTN (hypertension)   Ventral hernia with obstruction   1. Ventral hernia with partial small bowel obstruction. We'll continue NG tube to low wall suction. Continue n.p.o. with bowel rest. General surgery has been consulted and will follow along in case patient needs surgical management. 2. Possible urinary tract infection. Urinalysis indicates a possible infection. Continue Rocephin for now until  urine culture results are available. 3. Hypertension. Hold oral antihypertensives at this time. Use when necessary IV hydralazine. Restart oral medications once bowel issues are resolved. 4. Diabetes. Sliding scale insulin for now, hold oral hypoglycemics.  Code Status: full code Family Communication: discussed with patient and husband at the bedsidd Disposition Plan: discharge home once improved  Time spent: 14mins  Harlyn Rathmann [facility] Pager [phone]  If 7PM-7AM, please contact night-coverage [email or web address] Password [facility] 05/31/2013, 7:47 PM

## 2013-05-31 NOTE — ED Notes (Signed)
Pt with abd pain with N/V/D, denies vomiting today but x 3 yesterday, LBM yesterday

## 2013-06-01 LAB — GLUCOSE, CAPILLARY
Glucose-Capillary: 111 mg/dL — ABNORMAL HIGH (ref 70–99)
Glucose-Capillary: 113 mg/dL — ABNORMAL HIGH (ref 70–99)
Glucose-Capillary: 98 mg/dL (ref 70–99)

## 2013-06-01 LAB — BASIC METABOLIC PANEL
BUN: 19 mg/dL (ref 6–23)
Calcium: 9.5 mg/dL (ref 8.4–10.5)
Creatinine, Ser: 1.02 mg/dL (ref 0.50–1.10)
GFR calc Af Amer: 64 mL/min — ABNORMAL LOW (ref >90)
GFR calc non Af Amer: 56 mL/min — ABNORMAL LOW (ref >90)
Potassium: 3.4 mEq/L — ABNORMAL LOW (ref 3.5–5.1)

## 2013-06-01 LAB — CBC
MCHC: 32.6 g/dL (ref 30.0–36.0)
Platelets: 241 10*3/uL (ref 150–400)
RBC: 4.22 MIL/uL (ref 3.87–5.11)
RDW: 14.2 % (ref 11.5–15.5)

## 2013-06-01 MED ORDER — MORPHINE SULFATE 2 MG/ML IJ SOLN
2.0000 mg | INTRAMUSCULAR | Status: DC | PRN
  Administered 2013-06-01: 2 mg via INTRAVENOUS
  Filled 2013-06-01: qty 1

## 2013-06-01 MED ORDER — LORAZEPAM 2 MG/ML IJ SOLN
INTRAMUSCULAR | Status: AC
  Administered 2013-06-01: 1 mg via INTRAVENOUS
  Filled 2013-06-01: qty 1

## 2013-06-01 MED ORDER — SODIUM CHLORIDE 0.9 % IV SOLN: INTRAVENOUS | Status: DC

## 2013-06-01 MED ORDER — LORAZEPAM 2 MG/ML IJ SOLN
1.0000 mg | Freq: Once | INTRAMUSCULAR | Status: AC
  Administered 2013-06-01: 1 mg via INTRAVENOUS

## 2013-06-01 MED ORDER — POTASSIUM CHLORIDE IN NACL 20-0.9 MEQ/L-% IV SOLN
INTRAVENOUS | Status: DC
  Administered 2013-06-01 – 2013-06-02 (×2): via INTRAVENOUS

## 2013-06-01 NOTE — Progress Notes (Signed)
Late Entry:  Patient c/o of pain.  Morphine IV ordered.  NKDA.  IV Morphine administered slowly with NS infusing.  After administration, patient complained of burning to arm.  Infusion stopped.  IV disconnected.  Patient's arm became red and swollen at site.  IV removed.  Charge nurse and Dr. Roderic Palau notified.  Ice pack applied to patient's left arm.  Pt's VS, see flow sheet.  Dr. Roderic Palau gave order to discontinue Morphine IV.  Upon reassessment, patient stated burning and pain in arm relieved.

## 2013-06-01 NOTE — Consult Note (Signed)
Reason for Consult: Partial small bowel obstruction, incisional hernia Referring Physician: [facility]  Kayla Payne is an 67 y.o. female.  HPI: Patient is a 67 year old white female with a chronic history of incisional hernia who presented to [facility] with worsening nausea and vomiting. CT scan the abdomen and pelvis reveals a large incisional hernia with small bowel and transverse colon present in it. There appears to be a partial obstructive nature of this area. In talking with the patient and family, she has had multiple episodes of nausea and vomiting in the past. She has been seen by [place] for pain or of the hernia in the remote past, with but was told she needed to lose weight. Currently, she denies any abdominal pain. She has not had flatus or bowel movements.  Past Medical History  Diagnosis Date  . Hypertension   . Depression   . Neuralgia     trimenial  . Osteoarthritis   . Shortness of breath   . Diabetes mellitus without complication     Past Surgical History  Procedure Laterality Date  . Cesarean section      x2  . Cholecystectomy  2000    [place]  . Hernia repair  2000    right and left inguinal-[facility]  . Fracture surgery      skull fracture repair-behind left ear  . Exploration middle ear      hearing loss- with surgery. subsequently found skull fracture as reason for hearing loss.  . Cataract extraction w/phaco  05/16/2012    Procedure: CATARACT EXTRACTION PHACO AND INTRAOCULAR LENS PLACEMENT (IOC);  Surgeon: Tonny Branch, MD;  Location: [facility];  Service: Ophthalmology;  Laterality: Left;  CDE 9.31  . Cataract extraction w/phaco Right 08/19/2012    Procedure: CATARACT EXTRACTION PHACO AND INTRAOCULAR LENS PLACEMENT (IOC);  Surgeon: Tonny Branch, MD;  Location: [facility];  Service: Ophthalmology;  Laterality: Right;  CDE: 9.46    Family History  Problem Relation Age of Onset  . Hyperlipidemia Mother   . Hypertension Mother   .  Diabetes Mother   . Hyperlipidemia Father   . Hypertension Father     Social History:  reports that she has never smoked. She has never used smokeless tobacco. She reports that she does not drink alcohol or use illicit drugs.  Allergies: No Known Allergies  Medications: I have reviewed the patient's current medications.  Results for orders placed during the hospital encounter of 05/31/13 (from the past 48 hour(s))  CBC WITH DIFFERENTIAL     Status: Abnormal   Collection Time    05/31/13  1:41 PM      Result Value Range   WBC 11.6 (*) 4.0 - 10.5 K/uL   RBC 4.41  3.87 - 5.11 MIL/uL   Hemoglobin 14.4  12.0 - 15.0 g/dL   HCT 43.0  36.0 - 46.0 %   MCV 97.5  78.0 - 100.0 fL   MCH 32.7  26.0 - 34.0 pg   MCHC 33.5  30.0 - 36.0 g/dL   RDW 14.3  11.5 - 15.5 %   Platelets 234  150 - 400 K/uL   Neutrophils Relative % 85 (*) 43 - 77 %   Neutro Abs 9.8 (*) 1.7 - 7.7 K/uL   Lymphocytes Relative 6 (*) 12 - 46 %   Lymphs Abs 0.7  0.7 - 4.0 K/uL   Monocytes Relative 8  3 - 12 %   Monocytes Absolute 0.9  0.1 - 1.0 K/uL  Eosinophils Relative 1  0 - 5 %   Eosinophils Absolute 0.1  0.0 - 0.7 K/uL   Basophils Relative 0  0 - 1 %   Basophils Absolute 0.0  0.0 - 0.1 K/uL  COMPREHENSIVE METABOLIC PANEL     Status: Abnormal   Collection Time    05/31/13  1:41 PM      Result Value Range   Sodium 139  135 - 145 mEq/L   Potassium 3.7  3.5 - 5.1 mEq/L   Chloride 99  96 - 112 mEq/L   CO2 28  19 - 32 mEq/L   Glucose, Bld 153 (*) 70 - 99 mg/dL   BUN 25 (*) 6 - 23 mg/dL   Creatinine, Ser 1.21 (*) 0.50 - 1.10 mg/dL   Calcium 10.1  8.4 - 10.5 mg/dL   Total Protein 8.1  6.0 - 8.3 g/dL   Albumin 3.5  3.5 - 5.2 g/dL   AST 17  0 - 37 U/L   ALT 18  0 - 35 U/L   Alkaline Phosphatase 96  39 - 117 U/L   Total Bilirubin 0.6  0.3 - 1.2 mg/dL   GFR calc non Af Amer 45 (*) >90 mL/min   GFR calc Af Amer 52 (*) >90 mL/min   Comment: (NOTE)     The eGFR has been calculated using the CKD EPI equation.     This  calculation has not been validated in all clinical situations.     eGFR's persistently <90 mL/min signify possible Chronic Kidney     Disease.  URINALYSIS, ROUTINE W REFLEX MICROSCOPIC     Status: Abnormal   Collection Time    05/31/13  2:11 PM      Result Value Range   Color, Urine YELLOW  YELLOW   APPearance CLOUDY (*) CLEAR   Specific Gravity, Urine >1.030 (*) 1.005 - 1.030   pH 6.0  5.0 - 8.0   Glucose, UA NEGATIVE  NEGATIVE mg/dL   Hgb urine dipstick LARGE (*) NEGATIVE   Bilirubin Urine MODERATE (*) NEGATIVE   Ketones, ur TRACE (*) NEGATIVE mg/dL   Protein, ur 100 (*) NEGATIVE mg/dL   Urobilinogen, UA 0.2  0.0 - 1.0 mg/dL   Nitrite NEGATIVE  NEGATIVE   Leukocytes, UA NEGATIVE  NEGATIVE  URINE MICROSCOPIC-ADD ON     Status: Abnormal   Collection Time    05/31/13  2:11 PM      Result Value Range   Squamous Epithelial / LPF FEW (*) RARE   WBC, UA 3-6  <3 WBC/hpf   RBC / HPF 21-50  <3 RBC/hpf   Bacteria, UA MANY (*) RARE  GLUCOSE, CAPILLARY     Status: Abnormal   Collection Time    05/31/13  9:06 PM      Result Value Range   Glucose-Capillary 111 (*) 70 - 99 mg/dL  GLUCOSE, CAPILLARY     Status: None   Collection Time    06/01/13 12:00 AM      Result Value Range   Glucose-Capillary 98  70 - 99 mg/dL  GLUCOSE, CAPILLARY     Status: Abnormal   Collection Time    06/01/13  4:17 AM      Result Value Range   Glucose-Capillary 114 (*) 70 - 99 mg/dL  BASIC METABOLIC PANEL     Status: Abnormal   Collection Time    06/01/13  5:03 AM      Result Value Range   Sodium 134 (*) 135 -  145 mEq/L   Potassium 3.4 (*) 3.5 - 5.1 mEq/L   Chloride 95 (*) 96 - 112 mEq/L   CO2 28  19 - 32 mEq/L   Glucose, Bld 117 (*) 70 - 99 mg/dL   BUN 19  6 - 23 mg/dL   Creatinine, Ser 1.02  0.50 - 1.10 mg/dL   Calcium 9.5  8.4 - 10.5 mg/dL   GFR calc non Af Amer 56 (*) >90 mL/min   GFR calc Af Amer 64 (*) >90 mL/min   Comment: (NOTE)     The eGFR has been calculated using the CKD EPI equation.      This calculation has not been validated in all clinical situations.     eGFR's persistently <90 mL/min signify possible Chronic Kidney     Disease.  CBC     Status: None   Collection Time    06/01/13  5:03 AM      Result Value Range   WBC 9.8  4.0 - 10.5 K/uL   RBC 4.22  3.87 - 5.11 MIL/uL   Hemoglobin 13.4  12.0 - 15.0 g/dL   HCT 41.1  36.0 - 46.0 %   MCV 97.4  78.0 - 100.0 fL   MCH 31.8  26.0 - 34.0 pg   MCHC 32.6  30.0 - 36.0 g/dL   RDW 14.2  11.5 - 15.5 %   Platelets 241  150 - 400 K/uL  GLUCOSE, CAPILLARY     Status: Abnormal   Collection Time    06/01/13  7:40 AM      Result Value Range   Glucose-Capillary 113 (*) 70 - 99 mg/dL   Comment 1 Documented in Chart     Comment 2 Notify RN      Ct Abdomen Pelvis W Contrast  05/31/2013   CLINICAL DATA:  Abdominal pain, nausea, vomiting.  EXAM: CT ABDOMEN AND PELVIS WITH CONTRAST  TECHNIQUE: Multidetector CT imaging of the abdomen and pelvis was performed using the standard protocol following bolus administration of intravenous contrast.  CONTRAST:  65mL OMNIPAQUE IOHEXOL 300 MG/ML SOLN, 156mL OMNIPAQUE IOHEXOL 300 MG/ML SOLN  COMPARISON:  None.  FINDINGS: Heart is upper limits normal in size. No confluent airspace opacities in the lung bases. No effusions.  Prior cholecystectomy. Small hypodensities within the liver most likely reflect small cysts. Spleen, pancreas, adrenals are unremarkable. Numerous bilateral renal hypodensities compatible with cysts. No hydronephrosis.  There is a large left ventral wall hernia containing multiple small bowel loops and portions of the transverse colon. Small bowel loops leading into the hernia are dilated. Distal small bowel loops are decompressed. Findings concerning for partial small bowel obstruction.  Uterus, adnexae and urinary bladder are unremarkable. Sigmoid diverticulosis without active diverticulitis.  Diffuse degenerative changes throughout the thoracolumbar spine. No acute bony abnormality.   IMPRESSION: Large left ventral wall hernia containing transverse colon and multiple small bowel loops. Findings compatible with associated partial small bowel obstruction.  Hepatic and renal cysts.  Sigmoid diverticulosis.   Electronically Signed   By: Rolm Baptise M.D.   On: 05/31/2013 15:48    ROS: See chart Blood pressure 127/77, pulse 73, temperature 98.5 F (36.9 C), temperature source Oral, resp. rate 18, height 4\' 11"  (1.499 m), weight 107.049 kg (236 lb), SpO2 92.00%. Physical Exam: Obese white female in no acute distress. Abdomen is soft with a large incisional hernia to the left of the midline. Bowel sounds are present within it. No tenderness is noted within the hernia.  Assessment/Plan: Impression: Partial small bowel traction secondary to chronic incisional hernia. Plan: No need for acute surgical intervention at this time. Would continue NG tube decompression for now. This hernia may be too much to repair at this facility. I have told the patient we may make a referral to [facility] for further management and treatment of the hernia.  Fanchon Papania A 06/01/2013, 9:24 AM

## 2013-06-01 NOTE — Progress Notes (Signed)
[facility] PROGRESS NOTE  Kayla Payne SU:[id number] DOB: 1946-01-17 DOA: 05/31/2013 PCP: Gar Ponto, MD  Assessment/Plan: 1. Large ventral hernia with partial small bowel obstruction. Continue NG tube suction. Appreciate surgery assistance. Continue supportive management. We'll need for defer operative management to surgery 2. Hypertension. Stable 3. Diabetes. CBGs stable. 4. Hypokalemia. Replace 5. Possible urinary tract infection. Continue Rocephin for now until urine culture results are available.  Code Status: Full code Family Communication: Discussed with daughter at the bedside  Disposition Plan: Pending hospital course   Consultants:  General surgery  Procedures:  None  Antibiotics:  None  HPI/Subjective: Reports passing some gas today. She did not have a bowel movement today. She did develop some abdominal pain which has since resolved. No vomiting. Patient had an episode of burning and redness of her skin around the IV site when morphine was infused.  Objective: Filed Vitals:   06/01/13 1509  BP: 137/74  Pulse: 71  Temp: 98.1 F (36.7 C)  Resp: 18    Intake/Output Summary (Last 24 hours) at 06/01/13 1703 Last data filed at 06/01/13 1200  Gross per 24 hour  Intake    350 ml  Output   1000 ml  Net   -650 ml   Filed Weights   05/31/13 1316 05/31/13 1821  Weight: 152.409 kg (336 lb) 107.049 kg (236 lb)    Exam:   General:  NAd  Cardiovascular: s1, s2, rrr  Respiratory: cta b  Abdomen: soft, large vental hernia, bowel sounds present, no tenderness  Musculoskeletal: no pedal edema   Data Reviewed: Basic Metabolic Panel:  Recent Labs Lab 05/31/13 1341 06/01/13 0503  NA 139 134*  K 3.7 3.4*  CL 99 95*  CO2 28 28  GLUCOSE 153* 117*  BUN 25* 19  CREATININE 1.21* 1.02  CALCIUM 10.1 9.5   Liver Function Tests:  Recent Labs Lab 05/31/13 1341  AST 17  ALT 18  ALKPHOS 96  BILITOT 0.6  PROT 8.1  ALBUMIN 3.5   No  results found for this basename: LIPASE, AMYLASE,  in the last 168 hours No results found for this basename: AMMONIA,  in the last 168 hours CBC:  Recent Labs Lab 05/31/13 1341 06/01/13 0503  WBC 11.6* 9.8  NEUTROABS 9.8*  --   HGB 14.4 13.4  HCT 43.0 41.1  MCV 97.5 97.4  PLT 234 241   Cardiac Enzymes: No results found for this basename: CKTOTAL, CKMB, CKMBINDEX, TROPONINI,  in the last 168 hours BNP (last 3 results) No results found for this basename: PROBNP,  in the last 8760 hours CBG:  Recent Labs Lab 06/01/13 06/01/13 0417 06/01/13 0740 06/01/13 1124 06/01/13 1620  GLUCAP 98 114* 113* 113* 113*    No results found for this or any previous visit (from the past 240 hour(s)).   Studies: Ct Abdomen Pelvis W Contrast  05/31/2013   CLINICAL DATA:  Abdominal pain, nausea, vomiting.  EXAM: CT ABDOMEN AND PELVIS WITH CONTRAST  TECHNIQUE: Multidetector CT imaging of the abdomen and pelvis was performed using the standard protocol following bolus administration of intravenous contrast.  CONTRAST:  18mL OMNIPAQUE IOHEXOL 300 MG/ML SOLN, 185mL OMNIPAQUE IOHEXOL 300 MG/ML SOLN  COMPARISON:  None.  FINDINGS: Heart is upper limits normal in size. No confluent airspace opacities in the lung bases. No effusions.  Prior cholecystectomy. Small hypodensities within the liver most likely reflect small cysts. Spleen, pancreas, adrenals are unremarkable. Numerous bilateral renal hypodensities compatible with cysts. No hydronephrosis.  There  is a large left ventral wall hernia containing multiple small bowel loops and portions of the transverse colon. Small bowel loops leading into the hernia are dilated. Distal small bowel loops are decompressed. Findings concerning for partial small bowel obstruction.  Uterus, adnexae and urinary bladder are unremarkable. Sigmoid diverticulosis without active diverticulitis.  Diffuse degenerative changes throughout the thoracolumbar spine. No acute bony abnormality.   IMPRESSION: Large left ventral wall hernia containing transverse colon and multiple small bowel loops. Findings compatible with associated partial small bowel obstruction.  Hepatic and renal cysts.  Sigmoid diverticulosis.   Electronically Signed   By: Rolm Baptise M.D.   On: 05/31/2013 15:48    Scheduled Meds: . cefTRIAXone (ROCEPHIN)  IV  1 g Intravenous Q24H  . enoxaparin (LOVENOX) injection  50 mg Subcutaneous Q24H  . insulin aspart  0-15 Units Subcutaneous Q4H   Continuous Infusions: . sodium chloride 75 mL/hr at 06/01/13 0900    Principal Problem:   SBO (small bowel obstruction) Active Problems:   Type II or unspecified type diabetes mellitus with neurological manifestations, not stated as uncontrolled(250.60)   Morbid obesity   HTN (hypertension)   Ventral hernia with obstruction    Time spent: 36mins    Kayla Payne  [facility] Pager [phone]. If 7PM-7AM, please contact night-coverage at [email or web address], password [facility] 06/01/2013, 5:03 PM  LOS: 1 day

## 2013-06-01 NOTE — Progress Notes (Signed)
1845 - Patient's left arm reassessed.  Redness decreased.  Patient states burning relieved.

## 2013-06-02 LAB — URINE CULTURE

## 2013-06-02 LAB — GLUCOSE, CAPILLARY
Glucose-Capillary: 116 mg/dL — ABNORMAL HIGH (ref 70–99)
Glucose-Capillary: 126 mg/dL — ABNORMAL HIGH (ref 70–99)
Glucose-Capillary: 151 mg/dL — ABNORMAL HIGH (ref 70–99)
Glucose-Capillary: 90 mg/dL (ref 70–99)

## 2013-06-02 LAB — BASIC METABOLIC PANEL
Calcium: 9.6 mg/dL (ref 8.4–10.5)
Creatinine, Ser: 0.82 mg/dL (ref 0.50–1.10)
GFR calc Af Amer: 84 mL/min — ABNORMAL LOW (ref >90)
GFR calc non Af Amer: 72 mL/min — ABNORMAL LOW (ref >90)
Glucose, Bld: 127 mg/dL — ABNORMAL HIGH (ref 70–99)
Potassium: 3.4 mEq/L — ABNORMAL LOW (ref 3.5–5.1)
Sodium: 142 mEq/L (ref 135–145)

## 2013-06-02 MED ORDER — POTASSIUM CHLORIDE CRYS ER 20 MEQ PO TBCR
40.0000 meq | EXTENDED_RELEASE_TABLET | Freq: Once | ORAL | Status: AC
  Administered 2013-06-02: 40 meq via ORAL
  Filled 2013-06-02: qty 2

## 2013-06-02 NOTE — Progress Notes (Signed)
  Subjective: Patient denies abdominal pain. She has had flatus.  Objective: Vital signs in last 24 hours: Temp:  [97.8 F (36.6 C)-98.9 F (37.2 C)] 97.8 F (36.6 C) (11/24 0544) Pulse Rate:  [71-82] 82 (11/24 0544) Resp:  [18-20] 20 (11/24 0544) BP: (137-158)/(74-89) 153/89 mmHg (11/24 0544) SpO2:  [94 %-97 %] 97 % (11/24 0544) Last BM Date: 05/31/13  Intake/Output from previous day: 11/23 0701 - 11/24 0700 In: 0  Out: 600 [Urine:600] Intake/Output this shift:    GI: Soft. Active bowel sounds appreciated. Hernia is still present but not tense. No rigidity noted.  Lab Results:   Recent Labs  05/31/13 1341 06/01/13 0503  WBC 11.6* 9.8  HGB 14.4 13.4  HCT 43.0 41.1  PLT 234 241   BMET  Recent Labs  06/01/13 0503 06/02/13 0612  NA 134* 142  K 3.4* 3.4*  CL 95* 102  CO2 28 26  GLUCOSE 117* 127*  BUN 19 12  CREATININE 1.02 0.82  CALCIUM 9.5 9.6   PT/INR No results found for this basename: LABPROT, INR,  in the last 72 hours  Studies/Results: Ct Abdomen Pelvis W Contrast  05/31/2013   CLINICAL DATA:  Abdominal pain, nausea, vomiting.  EXAM: CT ABDOMEN AND PELVIS WITH CONTRAST  TECHNIQUE: Multidetector CT imaging of the abdomen and pelvis was performed using the standard protocol following bolus administration of intravenous contrast.  CONTRAST:  38mL OMNIPAQUE IOHEXOL 300 MG/ML SOLN, 148mL OMNIPAQUE IOHEXOL 300 MG/ML SOLN  COMPARISON:  None.  FINDINGS: Heart is upper limits normal in size. No confluent airspace opacities in the lung bases. No effusions.  Prior cholecystectomy. Small hypodensities within the liver most likely reflect small cysts. Spleen, pancreas, adrenals are unremarkable. Numerous bilateral renal hypodensities compatible with cysts. No hydronephrosis.  There is a large left ventral wall hernia containing multiple small bowel loops and portions of the transverse colon. Small bowel loops leading into the hernia are dilated. Distal small bowel loops  are decompressed. Findings concerning for partial small bowel obstruction.  Uterus, adnexae and urinary bladder are unremarkable. Sigmoid diverticulosis without active diverticulitis.  Diffuse degenerative changes throughout the thoracolumbar spine. No acute bony abnormality.  IMPRESSION: Large left ventral wall hernia containing transverse colon and multiple small bowel loops. Findings compatible with associated partial small bowel obstruction.  Hepatic and renal cysts.  Sigmoid diverticulosis.   Electronically Signed   By: Rolm Baptise M.D.   On: 05/31/2013 15:48    Anti-infectives: Anti-infectives   Start     Dose/Rate Route Frequency Ordered Stop   06/01/13 1600  cefTRIAXone (ROCEPHIN) 1 g in dextrose 5 % 50 mL IVPB     1 g 100 mL/hr over 30 Minutes Intravenous Every 24 hours 05/31/13 1947     05/31/13 1515  cefTRIAXone (ROCEPHIN) 1 g in dextrose 5 % 50 mL IVPB     1 g Intravenous  Once 05/31/13 1510 05/31/13 1609      Assessment/Plan: Impression: Large ventral hernia with partial bowel obstructive findings present. The obstructive portion seems to be resolving. Plan: We'll remove NG tube and start clear liquid diet. The hernia will need to be addressed as an outpatient.  LOS: 2 days    Kayla Payne A 06/02/2013

## 2013-06-02 NOTE — Progress Notes (Signed)
[facility] PROGRESS NOTE  Kayla Payne SU:[id number] DOB: 14-Feb-1946 DOA: 05/31/2013 PCP: Gar Ponto, MD  Assessment/Plan: 1. Large ventral hernia with partial small bowel obstruction. NG tube has been removed. Diet was started on clear liquids the patient appears to be tolerating this. We'll advance to soft foods in a.m. and if she continues to improve. Ventral hernia will be addressed as an outpatient at a tertiary center. 2. Hypertension. Stable 3. Diabetes. CBGs stable. 4. Hypokalemia. Replace 5. Possible urinary tract infection. Urine culture grew lactobacillus as well as group B strep. Will change to by mouth antibiotics tomorrow.  Code Status: Full code Family Communication: Discussed with patient Disposition Plan: Possible discharge home tomorrow if continues to improve   Consultants:  General surgery  Procedures:  None  Antibiotics:  None  HPI/Subjective: NG tube was removed today. Patient reports having 2-3 bowel movements  Objective: Filed Vitals:   06/02/13 1430  BP: 153/84  Pulse: 78  Temp: 98.3 F (36.8 C)  Resp: 20    Intake/Output Summary (Last 24 hours) at 06/02/13 1940 Last data filed at 06/02/13 1700  Gross per 24 hour  Intake   1222 ml  Output    600 ml  Net    622 ml   Filed Weights   05/31/13 1316 05/31/13 1821  Weight: 152.409 kg (336 lb) 107.049 kg (236 lb)    Exam:   General:  NAd  Cardiovascular: s1, s2, rrr  Respiratory: cta b  Abdomen: soft, large vental hernia, bowel sounds present, no tenderness  Musculoskeletal: no pedal edema   Data Reviewed: Basic Metabolic Panel:  Recent Labs Lab 05/31/13 1341 06/01/13 0503 06/02/13 0612  NA 139 134* 142  K 3.7 3.4* 3.4*  CL 99 95* 102  CO2 28 28 26   GLUCOSE 153* 117* 127*  BUN 25* 19 12  CREATININE 1.21* 1.02 0.82  CALCIUM 10.1 9.5 9.6   Liver Function Tests:  Recent Labs Lab 05/31/13 1341  AST 17  ALT 18  ALKPHOS 96  BILITOT 0.6  PROT 8.1   ALBUMIN 3.5   No results found for this basename: LIPASE, AMYLASE,  in the last 168 hours No results found for this basename: AMMONIA,  in the last 168 hours CBC:  Recent Labs Lab 05/31/13 1341 06/01/13 0503  WBC 11.6* 9.8  NEUTROABS 9.8*  --   HGB 14.4 13.4  HCT 43.0 41.1  MCV 97.5 97.4  PLT 234 241   Cardiac Enzymes: No results found for this basename: CKTOTAL, CKMB, CKMBINDEX, TROPONINI,  in the last 168 hours BNP (last 3 results) No results found for this basename: PROBNP,  in the last 8760 hours CBG:  Recent Labs Lab 06/02/13 0026 06/02/13 0431 06/02/13 0747 06/02/13 1241 06/02/13 1618  GLUCAP 113* 116* 126* 151* 90    Recent Results (from the past 240 hour(s))  URINE CULTURE     Status: None   Collection Time    05/31/13  2:11 PM      Result Value Range Status   Specimen Description URINE, CATHETERIZED   Final   Special Requests NONE   Final   Culture  Setup Time     Final   Value: 05/31/2013 20:55     Performed at [facility]     Final   Value: 20,OOO COLONIES/ML     Performed at [facility]   Culture     Final   Value: LACTOBACILLUS SPECIES  Note: Standardized susceptibility testing for this organism is not available.     GROUP B STREP(S.AGALACTIAE)ISOLATED     Note: TESTING AGAINST S. AGALACTIAE NOT ROUTINELY PERFORMED DUE TO PREDICTABILITY OF AMP/PEN/VAN SUSCEPTIBILITY.     Performed at [facility]   Report Status 06/02/2013 FINAL   Final     Studies: No results found.  Scheduled Meds: . cefTRIAXone (ROCEPHIN)  IV  1 g Intravenous Q24H  . enoxaparin (LOVENOX) injection  50 mg Subcutaneous Q24H  . insulin aspart  0-15 Units Subcutaneous Q4H   Continuous Infusions: . 0.9 % NaCl with KCl 20 mEq / L 50 mL/hr at 06/02/13 1422    Principal Problem:   SBO (small bowel obstruction) Active Problems:   Type II or unspecified type diabetes mellitus with neurological manifestations, not stated as  uncontrolled(250.60)   Morbid obesity   HTN (hypertension)   Ventral hernia with obstruction    Time spent: 72mins    Kayla Payne  [facility] Pager [phone]. If 7PM-7AM, please contact night-coverage at [email or web address], password [facility] 06/02/2013, 7:40 PM  LOS: 2 days

## 2013-06-03 LAB — GLUCOSE, CAPILLARY
Glucose-Capillary: 113 mg/dL — ABNORMAL HIGH (ref 70–99)
Glucose-Capillary: 141 mg/dL — ABNORMAL HIGH (ref 70–99)

## 2013-06-03 MED ORDER — AMOXICILLIN-POT CLAVULANATE 500-125 MG PO TABS: 1.0000 | ORAL_TABLET | Freq: Two times a day (BID) | ORAL | Status: DC

## 2013-06-03 NOTE — Progress Notes (Signed)
  Subjective: Tolerating clear liquid diet well. Hasn't had multiple bowel movements. Wishes to go home.  Objective: Vital signs in last 24 hours: Temp:  [98.3 F (36.8 C)-98.7 F (37.1 C)] 98.7 F (37.1 C) (11/25 0620) Pulse Rate:  [76-78] 76 (11/25 0620) Resp:  [20] 20 (11/25 0620) BP: (153-157)/(84-95) 157/95 mmHg (11/25 0620) SpO2:  [98 %] 98 % (11/25 0620) Last BM Date: 06/02/13  Intake/Output from previous day: 11/24 0701 - 11/25 0700 In: 1222 [I.V.:1222] Out: -  Intake/Output this shift:    GI: soft, non-tender; bowel sounds normal; no masses,  no organomegaly and Soft incisional hernia present.  Lab Results:   Recent Labs  05/31/13 1341 06/01/13 0503  WBC 11.6* 9.8  HGB 14.4 13.4  HCT 43.0 41.1  PLT 234 241   BMET  Recent Labs  06/01/13 0503 06/02/13 0612  NA 134* 142  K 3.4* 3.4*  CL 95* 102  CO2 28 26  GLUCOSE 117* 127*  BUN 19 12  CREATININE 1.02 0.82  CALCIUM 9.5 9.6   PT/INR No results found for this basename: LABPROT, INR,  in the last 72 hours  Studies/Results: No results found.  Anti-infectives: Anti-infectives   Start     Dose/Rate Route Frequency Ordered Stop   06/01/13 1600  cefTRIAXone (ROCEPHIN) 1 g in dextrose 5 % 50 mL IVPB     1 g 100 mL/hr over 30 Minutes Intravenous Every 24 hours 05/31/13 1947     05/31/13 1515  cefTRIAXone (ROCEPHIN) 1 g in dextrose 5 % 50 mL IVPB     1 g Intravenous  Once 05/31/13 1510 05/31/13 1609      Assessment/Plan: Impression: Large incisional hernia with resultant partial bowel obstruction. Obstructive symptoms have resolved. Plan: Patient may be discharged from surgical standpoint. She has been told she is to followup with a tertiary care center concerning repair of her incisional hernia.  LOS: 3 days    Jerrico Covello A 06/03/2013

## 2013-06-04 NOTE — Discharge Summary (Signed)
Physician Discharge Summary  Kayla Payne [id number] DOB: Oct 01, 1945 DOA: 05/31/2013  PCP: Gar Ponto, MD  Admit date: 05/31/2013 Discharge date: 06/03/2013  Time spent: 35 minutes  Recommendations for Outpatient Follow-up:  1. Patient will follow up at tertiary care center for management of her incisional hernia  Discharge Diagnoses:  Principal Problem:   SBO (small bowel obstruction) Active Problems:   Type II or unspecified type diabetes mellitus with neurological manifestations, not stated as uncontrolled(250.60)   Morbid obesity   HTN (hypertension)   Ventral hernia with obstruction UTI  Discharge Condition: improved  Diet recommendation: low salt, low carb  Filed Weights   05/31/13 1316 05/31/13 1821  Weight: 152.409 kg (336 lb) 107.049 kg (236 lb)    History of present illness:  Kayla Payne is a 67 y.o. female history of cholecystectomy multiple ventral hernias in the past, presents to the emergency room with complaints of vomiting. Patient has a chronic large ventral hernia and reports that approximately 6 days ago noticed onset of abdominal pain. As her symptoms progressed, she noticed abdominal distention. Yesterday, she began to have vomiting. She denies any hematemesis, melena or hematochezia. Her last bowel movement was yesterday. She reports that she was able to pass flatus yesterday, but has not passed any today. Denies any shortness of breath, chest pain, fever, cough, dysuria or any other complaints. In the emergency room initial evaluation indicated a large ventral hernia containing bowel and evidence of partial small bowel obstruction. NG tube was placed in the emergency room were 1200 cc of fluid was evacuated. The patient feels significantly better. The patient will be admitted for further treatments.   Hospital Course:  This patient was admitted to the hospital with abdominal pain and vomiting.  She has a large incisional hernia and developed  partial small bowel obstruction. She received NG tube decompression and was followed by general surgery.  She quickly improved with conservative treatment and did not require surgical management.  Due to the size of the hernia and the patient's obesity, she has been advised to follow up at a tertiary care center for repair of her incisional hernia. At the time of discharge, patient was moving her bowels, abdominal pain had resolved and she was not vomiting.   Procedures:  none  Consultations:  General Surgery  Discharge Exam: Filed Vitals:   06/03/13 0620  BP: 157/95  Pulse: 76  Temp: 98.7 F (37.1 C)  Resp: 20    General: NAD Cardiovascular: s1, s2, rrr Respiratory: cta b  Discharge Instructions  Discharge Orders   Future Appointments Provider Department Dept Phone   06/17/2013 3:00 PM Philmore Pali, NP [facility] [phone]   Future Orders Complete By Expires   Diet - low sodium heart healthy  As directed    Increase activity slowly  As directed        Medication List         ACCU-CHEK AVIVA PLUS test strip  Generic drug:  glucose blood     ACCU-CHEK SOFTCLIX LANCETS lancets     amLODipine 10 MG tablet  Commonly known as:  NORVASC  Take 10 mg by mouth daily.     amoxicillin-clavulanate 500-125 MG per tablet  Commonly known as:  AUGMENTIN  Take 1 tablet (500 mg total) by mouth 2 (two) times daily.     aspirin EC 81 MG tablet  Take 81 mg by mouth daily.     Cinnamon 500 MG Tabs  Take 500 mg  by mouth 2 (two) times daily.     cycloSPORINE 0.05 % ophthalmic emulsion  Commonly known as:  RESTASIS  Place 1 drop into both eyes 2 (two) times daily as needed.     Fish Oil 1200 MG Caps  Take 1,200 mg by mouth daily.     FLUoxetine 20 MG capsule  Commonly known as:  PROZAC  Take 20 mg by mouth at bedtime.     gabapentin 600 MG tablet  Commonly known as:  NEURONTIN  Take 1,200 mg by mouth 3 (three) times daily.      HYDROcodone-acetaminophen 5-325 MG per tablet  Commonly known as:  NORCO/VICODIN  Take 1 tablet by mouth every 6 (six) hours as needed for moderate pain.     losartan-hydrochlorothiazide 100-12.5 MG per tablet  Commonly known as:  HYZAAR  Take 1 tablet by mouth daily.     metFORMIN 500 MG 24 hr tablet  Commonly known as:  GLUCOPHAGE-XR  Take 1,000 mg by mouth daily with breakfast.     vitamin B-12 1000 MCG tablet  Commonly known as:  CYANOCOBALAMIN  Take 1,000 mcg by mouth daily.       Allergies  Allergen Reactions  . Morphine And Related Other (See Comments)       Follow-up Information   Follow up with Jamesetta So, MD. (As needed)    Specialty:  General Surgery   Contact information:   [address] [facility] [id number] [phone]        The results of significant diagnostics from this hospitalization (including imaging, microbiology, ancillary and laboratory) are listed below for reference.    Significant Diagnostic Studies: Ct Abdomen Pelvis W Contrast  05/31/2013   CLINICAL DATA:  Abdominal pain, nausea, vomiting.  EXAM: CT ABDOMEN AND PELVIS WITH CONTRAST  TECHNIQUE: Multidetector CT imaging of the abdomen and pelvis was performed using the standard protocol following bolus administration of intravenous contrast.  CONTRAST:  22mL OMNIPAQUE IOHEXOL 300 MG/ML SOLN, 143mL OMNIPAQUE IOHEXOL 300 MG/ML SOLN  COMPARISON:  None.  FINDINGS: Heart is upper limits normal in size. No confluent airspace opacities in the lung bases. No effusions.  Prior cholecystectomy. Small hypodensities within the liver most likely reflect small cysts. Spleen, pancreas, adrenals are unremarkable. Numerous bilateral renal hypodensities compatible with cysts. No hydronephrosis.  There is a large left ventral wall hernia containing multiple small bowel loops and portions of the transverse colon. Small bowel loops leading into the hernia are dilated. Distal small bowel loops are  decompressed. Findings concerning for partial small bowel obstruction.  Uterus, adnexae and urinary bladder are unremarkable. Sigmoid diverticulosis without active diverticulitis.  Diffuse degenerative changes throughout the thoracolumbar spine. No acute bony abnormality.  IMPRESSION: Large left ventral wall hernia containing transverse colon and multiple small bowel loops. Findings compatible with associated partial small bowel obstruction.  Hepatic and renal cysts.  Sigmoid diverticulosis.   Electronically Signed   By: Rolm Baptise M.D.   On: 05/31/2013 15:48    Microbiology: Recent Results (from the past 240 hour(s))  URINE CULTURE     Status: None   Collection Time    05/31/13  2:11 PM      Result Value Range Status   Specimen Description URINE, CATHETERIZED   Final   Special Requests NONE   Final   Culture  Setup Time     Final   Value: 05/31/2013 20:55     Performed at [place]  Final   Value: 20,OOO COLONIES/ML     Performed at [facility]   Culture     Final   Value: LACTOBACILLUS SPECIES     Note: Standardized susceptibility testing for this organism is not available.     GROUP B STREP(S.AGALACTIAE)ISOLATED     Note: TESTING AGAINST S. AGALACTIAE NOT ROUTINELY PERFORMED DUE TO PREDICTABILITY OF AMP/PEN/VAN SUSCEPTIBILITY.     Performed at [facility]   Report Status 06/02/2013 FINAL   Final     Labs: Basic Metabolic Panel:  Recent Labs Lab 05/31/13 1341 06/01/13 0503 06/02/13 0612  NA 139 134* 142  K 3.7 3.4* 3.4*  CL 99 95* 102  CO2 28 28 26   GLUCOSE 153* 117* 127*  BUN 25* 19 12  CREATININE 1.21* 1.02 0.82  CALCIUM 10.1 9.5 9.6   Liver Function Tests:  Recent Labs Lab 05/31/13 1341  AST 17  ALT 18  ALKPHOS 96  BILITOT 0.6  PROT 8.1  ALBUMIN 3.5   No results found for this basename: LIPASE, AMYLASE,  in the last 168 hours No results found for this basename: AMMONIA,  in the last 168 hours CBC:  Recent  Labs Lab 05/31/13 1341 06/01/13 0503  WBC 11.6* 9.8  NEUTROABS 9.8*  --   HGB 14.4 13.4  HCT 43.0 41.1  MCV 97.5 97.4  PLT 234 241   Cardiac Enzymes: No results found for this basename: CKTOTAL, CKMB, CKMBINDEX, TROPONINI,  in the last 168 hours BNP: BNP (last 3 results) No results found for this basename: PROBNP,  in the last 8760 hours CBG:  Recent Labs Lab 06/02/13 2010 06/03/13 0015 06/03/13 0340 06/03/13 0725 06/03/13 1119  GLUCAP 106* 130* 146* 113* 141*       Signed:  MEMON,JEHANZEB  [facility] 06/04/2013, 1:20 PM

## 2013-06-17 ENCOUNTER — Encounter: Payer: Self-pay | Admitting: Nurse Practitioner

## 2013-06-17 ENCOUNTER — Ambulatory Visit (INDEPENDENT_AMBULATORY_CARE_PROVIDER_SITE_OTHER): Payer: Medicare Other | Admitting: Nurse Practitioner

## 2013-06-17 ENCOUNTER — Encounter (INDEPENDENT_AMBULATORY_CARE_PROVIDER_SITE_OTHER): Payer: Self-pay

## 2013-06-17 VITALS — BP 139/73 | HR 66 | Ht 59.02 in | Wt 228.0 lb

## 2013-06-17 DIAGNOSIS — G500 Trigeminal neuralgia: Secondary | ICD-10-CM

## 2013-06-17 NOTE — Patient Instructions (Addendum)
PLAN:  Continue gabapentin 1200 mg 3 times daily for neurologic pain. May consider adding Topamax in the future if pain returns. Return for followup in 6 months, call earlier if necessary.    Trigeminal Neuralgia Trigeminal neuralgia is a nerve disorder that causes sudden attacks of severe facial pain. It is caused by damage to the trigeminal nerve, a major nerve in the face. It is more common in women and in the elderly, although it can also happen in younger patients. Attacks last from a few seconds to several minutes and can occur from a couple of times per year to several times per day. Trigeminal neuralgia can be a very distressing and disabling condition. Surgery may be needed in very severe cases if medical treatment does not give relief. HOME CARE INSTRUCTIONS   If your caregiver prescribed medication to help prevent attacks, take as directed.  To help prevent attacks:  Chew on the unaffected side of the mouth.  Avoid touching your face.  Avoid blasts of hot or cold air.  Men may wish to grow a beard to avoid having to shave. SEEK IMMEDIATE MEDICAL CARE IF:  Pain is unbearable and your medicine does not help.  You develop new, unexplained symptoms (problems).  You have problems that may be related to a medication you are taking. Document Released: 06/23/2000 Document Revised: 09/18/2011 Document Reviewed: 04/23/2009 [facility] Patient Information 2014 [place], [place].

## 2013-06-17 NOTE — Progress Notes (Signed)
PATIENT: Kayla Payne DOB: 06/14/1946   REASON FOR VISIT: follow up for trigeminal neuralgia HISTORY FROM: patient  HISTORY OF PRESENT ILLNESS: 85 year Caucasian lady with left jaw intermittent sharp shooting pain involving left lower jaw mailnly lasting few seconds. At times and for few hours sometimes.Pain is moderate to severe and triggered by cold exposure and used to be relieved by gabapentin which she takes 1200 mg three times daily and tolerates quite well but last 1 year pain is suboptimally relieved and more frequent . She has done relatively well last 2 weeks with no pain. She has not tried carbamezapine, topamax or lyrica. She had MRI brain 01/06/13 and mra brain at [facility] which showed 75% distal LVA stenosis..She also takes norco and vicodin for arthritic pain.She had full dental evaluation which was normal.   UPDATE 06/17/13 (LL):  Patient returns for follow up and pain has subsided soon after last visit.  It has tended to be cyclical; present for several months and then absent for a few months over the last 3 years.  She had a recent admission at [facility], with partial bowel obstruction.  She is being referred to have hernia surgery soon at [facility], has not scheduled yet.    ROS:  14 system review of systems is positive for trouble swallowing,diarrhoea,joint pain,aching muscles,weakness,difficulty swallowing,depression,anxiety,not enough sleep,decreased energy,sleepiness  ALLERGIES: Allergies  Allergen Reactions  . Morphine And Related Other (See Comments)    HOME MEDICATIONS: Outpatient Prescriptions Prior to Visit  Medication Sig Dispense Refill  . ACCU-CHEK AVIVA PLUS test strip       . ACCU-CHEK SOFTCLIX LANCETS lancets       . amLODipine (NORVASC) 10 MG tablet Take 10 mg by mouth daily.      Marland Kitchen amoxicillin-clavulanate (AUGMENTIN) 500-125 MG per tablet Take 1 tablet (500 mg total) by mouth 2 (two) times daily.  6 tablet  0  . aspirin EC 81 MG tablet Take 81  mg by mouth daily.      . Cinnamon 500 MG TABS Take 500 mg by mouth 2 (two) times daily.      . cycloSPORINE (RESTASIS) 0.05 % ophthalmic emulsion Place 1 drop into both eyes 2 (two) times daily as needed.       Marland Kitchen FLUoxetine (PROZAC) 20 MG capsule Take 20 mg by mouth at bedtime.       . gabapentin (NEURONTIN) 600 MG tablet Take 1,200 mg by mouth 3 (three) times daily.      Marland Kitchen HYDROcodone-acetaminophen (NORCO/VICODIN) 5-325 MG per tablet Take 1 tablet by mouth every 6 (six) hours as needed for moderate pain.      Marland Kitchen losartan-hydrochlorothiazide (HYZAAR) 100-12.5 MG per tablet Take 1 tablet by mouth daily.      . metFORMIN (GLUCOPHAGE-XR) 500 MG 24 hr tablet Take 1,000 mg by mouth daily with breakfast.      . Omega-3 Fatty Acids (FISH OIL) 1200 MG CAPS Take 1,200 mg by mouth daily.      . vitamin B-12 (CYANOCOBALAMIN) 1000 MCG tablet Take 1,000 mcg by mouth daily.       No facility-administered medications prior to visit.    PAST MEDICAL HISTORY: Past Medical History  Diagnosis Date  . Hypertension   . Depression   . Neuralgia     trimenial  . Osteoarthritis   . Shortness of breath   . Diabetes mellitus without complication     PAST SURGICAL HISTORY: Past Surgical History  Procedure Laterality Date  .  Cesarean section      x2  . Cholecystectomy  2000    [place]  . Hernia repair  2000    right and left inguinal-[facility]  . Fracture surgery      skull fracture repair-behind left ear  . Exploration middle ear      hearing loss- with surgery. subsequently found skull fracture as reason for hearing loss.  . Cataract extraction w/phaco  05/16/2012    Procedure: CATARACT EXTRACTION PHACO AND INTRAOCULAR LENS PLACEMENT (IOC);  Surgeon: Tonny Branch, MD;  Location: [facility];  Service: Ophthalmology;  Laterality: Left;  CDE 9.31  . Cataract extraction w/phaco Right 08/19/2012    Procedure: CATARACT EXTRACTION PHACO AND INTRAOCULAR LENS PLACEMENT (IOC);  Surgeon: Tonny Branch, MD;  Location: [facility];   Service: Ophthalmology;  Laterality: Right;  CDE: 9.46    FAMILY HISTORY: Family History  Problem Relation Age of Onset  . Hyperlipidemia Mother   . Hypertension Mother   . Diabetes Mother   . Hyperlipidemia Father   . Hypertension Father     SOCIAL HISTORY: History   Social History  . Marital Status: Legally Separated    Spouse Name: N/A    Number of Children: 2  . Years of Education: 12   Occupational History  . unemployed    Social History Main Topics  . Smoking status: Never Smoker   . Smokeless tobacco: Never Used  . Alcohol Use: No  . Drug Use: No  . Sexual Activity: No   Other Topics Concern  . Not on file   Social History Narrative   Patient lives at home with her boyfriend.   Caffeine Use: Tea   Patient have 2 children.    Patient has a high school education.    Patient is not currently working.           PHYSICAL EXAM  Filed Vitals:   06/17/13 1452  BP: 139/73  Pulse: 66  Height: 4' 11.02" (1.499 m)  Weight: 228 lb (103.42 kg)   Body mass index is 46.03 kg/(m^2).  Generalized: Well developed, in no acute distress  Head: normocephalic and atraumatic. Oropharynx benign  Neck: Supple, no carotid bruits  Cardiac: Regular rate rhythm, no murmur  Musculoskeletal: No deformity   Neurologic Exam  Mental Status: Awake and fully alert. Oriented to place and time. Recent and remote memory intact. Attention span, concentration and fund of knowledge appropriate. Mood and affect appropriate.  Cranial Nerves: Fundoscopic exam reveals sharp disc margins. Pupils equal, briskly reactive to light. Extraocular movements full without nystagmus. Visual fields full to confrontation. Hearing intact. Facial sensation intact. Face, tongue, palate moves normally and symmetrically.  Motor: Normal bulk and tone. Normal strength in all tested extremity muscles.  Sensory.: diminished touch and pinprick and vibratory sensation from ankle down bilaterally.  Coordination:  Rapid alternating movements normal in all extremities. Finger-to-nose and heel-to-shin performed accurately bilaterally.  Gait and Station: Arises from chair without difficulty. Stance is broad based.uses a cane. Gait demonstrates normal stride length and mild imbalance. Not able to heel, toe and tandem walk without difficulty.  Reflexes: 1+ and symmetric except both knee and ankle jerks are absent. Toes downgoing.   DIAGNOSTIC DATA (LABS, IMAGING, TESTING) - I reviewed patient records, labs, notes, testing and imaging myself where available.  Lab Results  Component Value Date   WBC 9.8 06/01/2013   HGB 13.4 06/01/2013   HCT 41.1 06/01/2013   MCV 97.4 06/01/2013   PLT 241 06/01/2013  Component Value Date/Time   NA 142 06/02/2013 0612   K 3.4* 06/02/2013 0612   CL 102 06/02/2013 0612   CO2 26 06/02/2013 0612   GLUCOSE 127* 06/02/2013 0612   BUN 12 06/02/2013 0612   CREATININE 0.82 06/02/2013 0612   CALCIUM 9.6 06/02/2013 0612   PROT 8.1 05/31/2013 1341   ALBUMIN 3.5 05/31/2013 1341   AST 17 05/31/2013 1341   ALT 18 05/31/2013 1341   ALKPHOS 96 05/31/2013 1341   BILITOT 0.6 05/31/2013 1341   GFRNONAA 72* 06/02/2013 0612   GFRAA 84* 06/02/2013 0612   ASSESSMENT AND PLAN 36 year Caucasian lady with left jaw paroxysmal neuralgic pain for 3 years likely trigeminal neuralgia affecting V 3 division.  Pain has susbided since last visit.  PLAN:  Continue gabapentin 1200 mg 3 times daily for neurologic pain as needed. May consider adding Topamax in the future if pain returns.  Pamphlet given about trigeminal neuralgia.  Return for followup in 6 months, call earlier if necessary.   Return in about 6 months (around 12/16/2013).  Philmore Pali, MSN, NP-C 06/17/2013, 4:04 PM [facility] [address], [place], [place] [address] [phone]  Note: This document was prepared with digital dictation and possible smart phrase technology. Any transcriptional  errors that result from this process are unintentional.

## 2013-08-18 DIAGNOSIS — M793 Panniculitis, unspecified: Secondary | ICD-10-CM | POA: Insufficient documentation

## 2013-12-17 ENCOUNTER — Ambulatory Visit (INDEPENDENT_AMBULATORY_CARE_PROVIDER_SITE_OTHER): Payer: Medicare Other | Admitting: Nurse Practitioner

## 2013-12-17 ENCOUNTER — Telehealth: Payer: Self-pay | Admitting: Neurology

## 2013-12-17 ENCOUNTER — Encounter (INDEPENDENT_AMBULATORY_CARE_PROVIDER_SITE_OTHER): Payer: Self-pay

## 2013-12-17 ENCOUNTER — Encounter: Payer: Self-pay | Admitting: Nurse Practitioner

## 2013-12-17 VITALS — BP 138/94 | HR 90 | Temp 98.7°F | Wt 193.0 lb

## 2013-12-17 DIAGNOSIS — G500 Trigeminal neuralgia: Secondary | ICD-10-CM

## 2013-12-17 MED ORDER — TOPIRAMATE 25 MG PO TABS: ORAL_TABLET | ORAL | Status: DC

## 2013-12-17 NOTE — Progress Notes (Signed)
PATIENT: Kayla Payne DOB: 03/10/1946  REASON FOR VISIT: routine follow up for trigeminal neuralgia HISTORY FROM: patient  HISTORY OF PRESENT ILLNESS: 48 year Caucasian lady with left jaw intermittent sharp shooting pain involving left lower jaw mailnly lasting few seconds. At times and for few hours sometimes.  Pain is moderate to severe and triggered by cold exposure and used to be relieved by gabapentin which she takes 1200 mg three times daily and tolerates quite well but last 1 year pain is suboptimally relieved and more frequent.  She has done relatively well last 2 weeks with no pain. She has not tried carbamezapine, topamax or lyrica. She had MRI brain 01/06/13 and mra brain at [facility] which showed 75% distal LVA stenosis.  She also takes norco and vicodin for arthritic pain.She had full dental evaluation which was normal.   UPDATE 06/17/13 (LL): Patient returns for follow up and pain has subsided soon after last visit. It has tended to be cyclical; present for several months and then absent for a few months over the last 3 years. She had a recent admission at [facility], with partial bowel obstruction. She is being referred to have hernia surgery soon at [facility], has not scheduled yet.   UPDATE 12/17/13 (LL): Since last visit, patient had ventral hernia surgery at [facility] on 10/28/13 which has been a very tough recovery for her.  Recovery was complicated by panniculitis.  Since the surgery her trigeminal neuralgia has flared up.  The Gabapentin 1200 mg tid is not relieving her symptoms.  Pain is sharp and aching in left jaw.    ROS:  14 system review of systems is positive for trouble swallowing, diarrhea, joint pain, aching muscles, weakness, difficulty swallowing, depression, anxiety, not enough sleep, decreased energy, sleepiness  ALLERGIES: Allergies  Allergen Reactions  . Morphine Swelling  . Morphine And Related Other (See Comments) and Rash    HOME  MEDICATIONS: Outpatient Prescriptions Prior to Visit  Medication Sig Dispense Refill  . ACCU-CHEK AVIVA PLUS test strip       . ACCU-CHEK SOFTCLIX LANCETS lancets       . amoxicillin-clavulanate (AUGMENTIN) 500-125 MG per tablet Take 1 tablet (500 mg total) by mouth 2 (two) times daily.  6 tablet  0  . aspirin EC 81 MG tablet Take 81 mg by mouth daily.      . cycloSPORINE (RESTASIS) 0.05 % ophthalmic emulsion Place 1 drop into both eyes 2 (two) times daily as needed.       . gabapentin (NEURONTIN) 600 MG tablet Take 1,200 mg by mouth 3 (three) times daily.      Marland Kitchen HYDROcodone-acetaminophen (NORCO/VICODIN) 5-325 MG per tablet Take 1 tablet by mouth every 6 (six) hours as needed for moderate pain.      Marland Kitchen losartan-hydrochlorothiazide (HYZAAR) 100-12.5 MG per tablet Take 1 tablet by mouth daily.      . Omega-3 Fatty Acids (FISH OIL) 1200 MG CAPS Take 1,200 mg by mouth daily.      . vitamin B-12 (CYANOCOBALAMIN) 1000 MCG tablet Take 1,000 mcg by mouth daily.      Marland Kitchen amLODipine (NORVASC) 10 MG tablet Take 10 mg by mouth daily.      . Cinnamon 500 MG TABS Take 500 mg by mouth 2 (two) times daily.      Marland Kitchen FLUoxetine (PROZAC) 20 MG capsule Take 20 mg by mouth at bedtime.       . metFORMIN (GLUCOPHAGE-XR) 500 MG 24 hr tablet Take 1,000  mg by mouth daily with breakfast.       No facility-administered medications prior to visit.     PHYSICAL EXAM  Filed Vitals:   12/17/13 1350  BP: 138/94  Pulse: 90  Temp: 98.7 F (37.1 C)  TempSrc: Oral  Weight: 193 lb (87.544 kg)   Body mass index is 38.96 kg/(m^2). No exam data present   Generalized: Well developed, in no acute distress  Head: normocephalic and atraumatic. Oropharynx benign  Neck: Supple, no carotid bruits  Cardiac: Regular rate rhythm, no murmur  Musculoskeletal: No deformity   Neurologic Exam  Mental Status: Awake and fully alert. Oriented to place and time. Recent and remote memory intact. Attention span, concentration and fund of  knowledge appropriate. Mood and affect are depressed.  Cranial Nerves: Fundoscopic exam not done. Pupils equal, briskly reactive to light. Extraocular movements full without nystagmus. Visual fields full to confrontation. Hearing intact. Facial sensation intact. Face, tongue, palate moves normally and symmetrically.  Motor: Normal bulk and tone. Normal strength in all tested extremity muscles.  Sensory: diminished touch and pinprick and vibratory sensation from ankle down bilaterally.  Coordination: Rapid alternating movements normal in all extremities. Finger-to-nose and heel-to-shin performed accurately bilaterally.  Gait and Station: Arises from chair without difficulty. Stance is broad based.uses a cane. Gait demonstrates normal stride length and mild imbalance. Not able to heel, toe and tandem walk without difficulty. Using cane. Reflexes: 1+ and symmetric except both knee and ankle jerks are absent.   ASSESSMENT AND PLAN 9 year Caucasian lady with left jaw paroxysmal neuralgic pain for 3 years likely trigeminal neuralgia affecting V 3 division. Pain has flared since ventral hernia surgery in April, suboptimally treated with Gabapentin.   PLAN:  Continue gabapentin 1200 mg 3 times daily for neurologic pain as needed. Add Topamax, 25 mg daily at hs for 1 week, then 1 tablet bid for 1 week.  Add 1 tablet each week until jaw pain subsides.  Max 100 mg daily.  Return for followup with Dr. Leonie Man in 4 weeks, call earlier if necessary.   Meds ordered this encounter  Medications  . topiramate (TOPAMAX) 25 MG tablet    Sig: Take 1 tablet at bedtime.  After 1 week, increase to 1 tablet twice daily.  Increase dose by 1 tablet each week until jaw pain resolves.    Dispense:  120 tablet    Refill:  2    Order Specific Question:  Supervising Provider    Answer:  Garvin Fila [[id number]]   Return in about 4 weeks (around 01/14/2014).  Philmore Pali, MSN, NP-C 12/18/2013, 9:43 AM [facility] [address] [place],  [address] [phone]  Note: This document was prepared with digital dictation and possible smart phrase technology. Any transcriptional errors that result from this process are unintentional.

## 2013-12-17 NOTE — Telephone Encounter (Signed)
Patient needs to follow up in 4 weeks with Dr. Leonie Man per [place]--please call patient to schedule--thank you.

## 2013-12-17 NOTE — Patient Instructions (Signed)
I have sent in a prescription for Topirimate 25 mg.  Take 1 tablet at bedtime.  After 1 week, increase to 1 tablet twice daily.  Increase dose by 1 tablet each week (adding at bedtime first) until jaw pain resolves.    If not better after 4 weeks, call the office, we will have to change the medication.   Trigeminal Neuralgia Trigeminal neuralgia is a nerve disorder that causes sudden attacks of severe facial pain. It is caused by damage to the trigeminal nerve, a major nerve in the face. It is more common in women and in the elderly, although it can also happen in younger patients. Attacks last from a few seconds to several minutes and can occur from a couple of times per year to several times per day. Trigeminal neuralgia can be a very distressing and disabling condition. Surgery may be needed in very severe cases if medical treatment does not give relief. HOME CARE INSTRUCTIONS   If your caregiver prescribed medication to help prevent attacks, take as directed.  To help prevent attacks:  Chew on the unaffected side of the mouth.  Avoid touching your face.  Avoid blasts of hot or cold air.  Men may wish to grow a beard to avoid having to shave. SEEK IMMEDIATE MEDICAL CARE IF:  Pain is unbearable and your medicine does not help.  You develop new, unexplained symptoms (problems).  You have problems that may be related to a medication you are taking. Document Released: 06/23/2000 Document Revised: 09/18/2011 Document Reviewed: 04/23/2009 [facility] Patient Information 2014 [place], [place].

## 2013-12-18 ENCOUNTER — Encounter: Payer: Self-pay | Admitting: Nurse Practitioner

## 2013-12-18 DIAGNOSIS — F329 Major depressive disorder, single episode, unspecified: Secondary | ICD-10-CM | POA: Insufficient documentation

## 2013-12-18 DIAGNOSIS — M179 Osteoarthritis of knee, unspecified: Secondary | ICD-10-CM | POA: Insufficient documentation

## 2013-12-18 DIAGNOSIS — M1710 Unilateral primary osteoarthritis, unspecified knee: Secondary | ICD-10-CM | POA: Insufficient documentation

## 2013-12-18 DIAGNOSIS — R262 Difficulty in walking, not elsewhere classified: Secondary | ICD-10-CM | POA: Insufficient documentation

## 2013-12-18 DIAGNOSIS — F32A Depression, unspecified: Secondary | ICD-10-CM | POA: Insufficient documentation

## 2013-12-18 DIAGNOSIS — H04129 Dry eye syndrome of unspecified lacrimal gland: Secondary | ICD-10-CM | POA: Insufficient documentation

## 2013-12-18 DIAGNOSIS — K430 Incisional hernia with obstruction, without gangrene: Secondary | ICD-10-CM | POA: Insufficient documentation

## 2013-12-18 DIAGNOSIS — M549 Dorsalgia, unspecified: Secondary | ICD-10-CM | POA: Insufficient documentation

## 2013-12-22 NOTE — Telephone Encounter (Signed)
RV appt made for 01-14-14 at 1430 with Dr. Leonie Man.  Pt confirmed.

## 2013-12-22 NOTE — Progress Notes (Signed)
I agree with above 

## 2014-01-14 ENCOUNTER — Ambulatory Visit (INDEPENDENT_AMBULATORY_CARE_PROVIDER_SITE_OTHER): Payer: Medicare Other | Admitting: Neurology

## 2014-01-14 ENCOUNTER — Encounter: Payer: Self-pay | Admitting: Neurology

## 2014-01-14 VITALS — BP 128/80 | HR 68 | Wt 182.0 lb

## 2014-01-14 DIAGNOSIS — G500 Trigeminal neuralgia: Secondary | ICD-10-CM

## 2014-01-14 MED ORDER — PREGABALIN 75 MG PO CAPS: 75.0000 mg | ORAL_CAPSULE | Freq: Three times a day (TID) | ORAL | Status: DC

## 2014-01-14 NOTE — Patient Instructions (Addendum)
I had a long discussion the patient with regards to her refractive trigeminal neuralgia and discussed  Available treatment options including possible referral to neurosurgery for gamma knife treatment but  the patient does not want this at the current time. Discontinue Topamax as it has not been effective instead try Lyrica 75 g twice daily for one week and then increase to 150 twice daily if tolerated.f this is not effective may consider switching to baclofen or carbamazepine in the future  Continue gabapentin 1200 mg 3 times daily. Return for followup in 4 weeks or call if necessary.  Trigeminal Neuralgia Trigeminal neuralgia is a nerve disorder that causes sudden attacks of severe facial pain. It is caused by damage to the trigeminal nerve, a major nerve in the face. It is more common in women and in the elderly, although it can also happen in younger patients. Attacks last from a few seconds to several minutes and can occur from a couple of times per year to several times per day. Trigeminal neuralgia can be a very distressing and disabling condition. Surgery may be needed in very severe cases if medical treatment does not give relief. HOME CARE INSTRUCTIONS   If your caregiver prescribed medication to help prevent attacks, take as directed.  To help prevent attacks:  Chew on the unaffected side of the mouth.  Avoid touching your face.  Avoid blasts of hot or cold air.  Men may wish to grow a beard to avoid having to shave. SEEK IMMEDIATE MEDICAL CARE IF:  Pain is unbearable and your medicine does not help.  You develop new, unexplained symptoms (problems).  You have problems that may be related to a medication you are taking. Document Released: 06/23/2000 Document Revised: 09/18/2011 Document Reviewed: 04/23/2009 [facility] Patient Information 2015 [place], [place]. This information is not intended to replace advice given to you by your health care provider. Make sure you discuss any  questions you have with your health care provider.

## 2014-01-14 NOTE — Progress Notes (Signed)
PATIENT: Kayla Payne DOB: Jul 26, 1945  REASON FOR VISIT: routine follow up for trigeminal neuralgia HISTORY FROM: patient  HISTORY OF PRESENT ILLNESS: 78 year Caucasian lady with left jaw intermittent sharp shooting pain involving left lower jaw mailnly lasting few seconds. At times and for few hours sometimes.  Pain is moderate to severe and triggered by cold exposure and used to be relieved by gabapentin which she takes 1200 mg three times daily and tolerates quite well but last 1 year pain is suboptimally relieved and more frequent.  She has done relatively well last 2 weeks with no pain. She has not tried carbamezapine, topamax or lyrica. 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  She also takes norco and vicodin for arthritic pain.She had full dental evaluation which was normal.   UPDATE 06/17/13 (LL): Patient returns for follow up and pain has subsided soon after last visit. It has tended to be cyclical; present for several months and then absent for a few months over the last 3 years. She had a recent admission at [facility], with partial bowel obstruction. She is being referred to have hernia surgery soon at [facility], has not scheduled yet.   UPDATE 12/17/13 (LL): Since last visit, patient had ventral hernia surgery at [facility] on 10/28/13 which has been a very tough recovery for her.  Recovery was complicated by panniculitis.  Since the surgery her trigeminal neuralgia has flared up.  The Gabapentin 1200 mg tid is not relieving her symptoms.  Pain is sharp and aching in left jaw.   Update 01/14/2014 : She returns for followup of the last with a month ago. She does not report significant benefit after adding Topamax which she is taking currently at the toes of 51 g twice daily she storing it well but it has not particularly helped. She remains on the appended 103 times daily. She has not tried Lyrica, carbamazepine or baclofen yet. The pain  is intermittent but present every time she eats or drinks anything. Pain is severe and intolerable. I discussed with the patient done ononmedication treatment options including trigeminal nerve surgery  With gamma knife or microvascular decompression but she is unwilling to discuss surgical options at the present time. ROS:  14 system review of systems is positive for trouble swallowing, diarrhea, joint pain, aching muscles, weakness, difficulty swallowing, depression, anxiety, not enough sleep, decreased energy, sleepiness  ALLERGIES: Allergies  Allergen Reactions  . Morphine Swelling  . Morphine And Related Other (See Comments) and Rash    HOME MEDICATIONS: Outpatient Prescriptions Prior to Visit  Medication Sig Dispense Refill  . ACCU-CHEK AVIVA PLUS test strip       . ACCU-CHEK SOFTCLIX LANCETS lancets       . amLODipine (NORVASC) 10 MG tablet       . aspirin EC 81 MG tablet Take 81 mg by mouth daily.      . Black Cohosh 40 MG CAPS Take by mouth daily.      . cycloSPORINE (RESTASIS) 0.05 % ophthalmic emulsion Place 1 drop into both eyes 2 (two) times daily as needed.       Marland Kitchen FLUoxetine (PROZAC) 40 MG capsule Take 40 mg by mouth at bedtime.      . gabapentin (NEURONTIN) 600 MG tablet Take 1,200 mg by mouth 3 (three) times daily.      Marland Kitchen HYDROcodone-acetaminophen (NORCO/VICODIN) 5-325 MG per tablet Take 1 tablet by mouth every 6 (  six) hours as needed for moderate pain.      Marland Kitchen KLOR-CON M20 20 MEQ tablet       . losartan-hydrochlorothiazide (HYZAAR) 100-12.5 MG per tablet Take 1 tablet by mouth daily.      . Omega-3 Fatty Acids (FISH OIL) 1200 MG CAPS Take 1,200 mg by mouth daily.      . potassium chloride (KLOR-CON) 20 MEQ packet Take by mouth daily.      . vitamin B-12 (CYANOCOBALAMIN) 1000 MCG tablet Take 1,000 mcg by mouth daily.      Marland Kitchen amoxicillin-clavulanate (AUGMENTIN) 500-125 MG per tablet Take 1 tablet (500 mg total) by mouth 2 (two) times daily.  6 tablet  0  . cephALEXin  (KEFLEX) 500 MG capsule       . glipiZIDE (GLUCOTROL) 10 MG tablet Take 10 mg by mouth daily before breakfast.      . oxyCODONE (OXY IR/ROXICODONE) 5 MG immediate release tablet       . topiramate (TOPAMAX) 25 MG tablet Take 1 tablet at bedtime.  After 1 week, increase to 1 tablet twice daily.  Increase dose by 1 tablet each week until jaw pain resolves.  120 tablet  2   No facility-administered medications prior to visit.     PHYSICAL EXAM  Filed Vitals:   01/14/14 1428  BP: 128/80  Pulse: 68  Weight: 182 lb (82.555 kg)   Body mass index is 36.74 kg/(m^2). No exam data present   Generalized: Well developed, in no acute distress  Head: normocephalic and atraumatic. Oropharynx benign  Neck: Supple, no carotid bruits  Cardiac: Regular rate rhythm, no murmur  Musculoskeletal: No deformity   Neurologic Exam  Mental Status: Awake and fully alert. Oriented to place and time. Recent and remote memory intact. Attention span, concentration and fund of knowledge appropriate. Mood and affect are depressed.  Cranial Nerves: Fundoscopic exam not done. Pupils equal, briskly reactive to light. Extraocular movements full without nystagmus. Visual fields full to confrontation. Hearing intact. Facial sensation intact. Face, tongue, palate moves normally and symmetrically.  Motor: Normal bulk and tone. Normal strength in all tested extremity muscles.  Sensory: diminished touch and pinprick and vibratory sensation from ankle down bilaterally.  Coordination: Rapid alternating movements normal in all extremities. Finger-to-nose and heel-to-shin performed accurately bilaterally.  Gait and Station: Arises from chair without difficulty. Stance is broad based.uses a cane. Gait demonstrates normal stride length and mild imbalance. Not able to heel, toe and tandem walk without difficulty. Using cane. Reflexes: 1+ and symmetric except both knee and ankle jerks are absent.   ASSESSMENT AND PLAN 67 year  Caucasian lady with left jaw paroxysmal neuralgic pain for 3 years likely trigeminal neuralgia affecting V 3 division. Pain has flared since ventral hernia surgery in April, suboptimally treated with Gabapentin and Topamax.   PLAN:  I had a long discussion the patient with regards to her refractive trigeminal neuralgia and discussed  Available treatment options including possible referral to neurosurgery for gamma knife treatment but  the patient does not want this at the current time. Discontinue Topamax as it has not been effective instead try Lyrica 75 g twice daily for one week and then increase to 150 twice daily if tolerated. If this is not effective may consider switching to baclofen or carbamazepine in the future Continue gabapentin 1200 mg 3 times daily. Return for followup in 4 weeks or call if necessary.  Return in about 4 weeks (around 02/11/2014).  Antony Contras, MD  01/14/2014, 4:58 PM [facility] [address], [place], [place] [address] [phone]  Note: This document was prepared with digital dictation and possible smart phrase technology. Any transcriptional errors that result from this process are unintentional.

## 2014-01-22 ENCOUNTER — Telehealth: Payer: Self-pay | Admitting: Neurology

## 2014-01-22 NOTE — Telephone Encounter (Signed)
Spoke to patient and advised her to increase lyrica to 150 mg twice daily for few days and call back . If no relief will stop Lyrica and change to carbamezapine instead.

## 2014-01-22 NOTE — Telephone Encounter (Signed)
Calling to report that the pregabalin (LYRICA) 75 MG capsule is not working. Please call

## 2014-01-22 NOTE — Telephone Encounter (Signed)
Please advise previous note. Thanks  °

## 2014-02-09 ENCOUNTER — Ambulatory Visit (INDEPENDENT_AMBULATORY_CARE_PROVIDER_SITE_OTHER): Payer: Medicare Other | Admitting: Neurology

## 2014-02-09 ENCOUNTER — Encounter: Payer: Self-pay | Admitting: Neurology

## 2014-02-09 VITALS — BP 128/76 | HR 67 | Ht 59.0 in | Wt 183.8 lb

## 2014-02-09 DIAGNOSIS — G500 Trigeminal neuralgia: Secondary | ICD-10-CM

## 2014-02-09 MED ORDER — PREGABALIN 150 MG PO CAPS: 150.0000 mg | ORAL_CAPSULE | Freq: Three times a day (TID) | ORAL | Status: DC

## 2014-02-09 NOTE — Patient Instructions (Signed)
I had a long discussion with the patient regarding her refractory trigeminal large pain. Since she has shown some response to Lyrica but the response is short lasting I would recommend increasing the frequency to 3 times daily. If she continues to have unsatisfactory response may consider adding carbamazepine or baclofen later. She continues to refuse referral to neurosurgeon for gamma knife or a microvascular decompression Return for followup in 6 weeks or call earlier if necessary.

## 2014-02-09 NOTE — Progress Notes (Signed)
Marland Kitchen      PATIENT: Kayla Payne DOB: 07-Aug-1945  REASON FOR VISIT: routine follow up for trigeminal neuralgia HISTORY FROM: patient  HISTORY OF PRESENT ILLNESS: 72 year Caucasian lady with left jaw intermittent sharp shooting pain involving left lower jaw mailnly lasting few seconds. At times and for few hours sometimes.  Pain is moderate to severe and triggered by cold exposure and used to be relieved by gabapentin which she takes 1200 mg three times daily and tolerates quite well but last 1 year pain is suboptimally relieved and more frequent.  She has done relatively well last 2 weeks with no pain. She has not tried carbamezapine, topamax or lyrica. 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  She also takes norco and vicodin for arthritic pain.She had full dental evaluation which was normal.  Update 02/09/14 : She returns for followup of her last visit the month ago. She states that Lyrica it did help her for the first to 3 weeks but now the pain seems to be coming back. She gets fairly good relief in the morning when she takes her first dose but lunchtime the pain seems to return. She is not having significant dizziness or other side effects. She has discontinue Topamax and remains on Gabapentin 1200mg  TID.  y. She continues to refuse referral to neurosurgery for, gammaknife or microvascular decompression.   Last visit 01/14/2014 : She returns for followup of the last with a month ago. She does not report significant benefit after adding Topamax which she is taking currently at the dose of 50 mg twice daily she is tolerating it well but it has not particularly helped. She remains on t Gabapentin 1200mg  three times daily. She has not tried Lyrica, carbamazepine or baclofen yet. The pain is intermittent but present every time she eats or drinks anything. Pain is severe and intolerable. I discussed with the patient surgical  treatment options  including trigeminal nerve surgery with gamma knife or microvascular decompression but she is unwilling to discuss surgical options at the present time. ROS:  14 system review of systems is positive for insomnia, frequent waking, back pain, aching muscles, walking difficulty, joint pain, depression, anxiety, nervousness, appetite ALLERGIES: Allergies  Allergen Reactions  . Morphine Swelling  . Morphine And Related Other (See Comments) and Rash    HOME MEDICATIONS: Outpatient Prescriptions Prior to Visit  Medication Sig Dispense Refill  . ACCU-CHEK AVIVA PLUS test strip       . ACCU-CHEK SOFTCLIX LANCETS lancets       . amLODipine (NORVASC) 10 MG tablet       . aspirin EC 81 MG tablet Take 81 mg by mouth daily.      . Black Cohosh 40 MG CAPS Take by mouth daily.      . cycloSPORINE (RESTASIS) 0.05 % ophthalmic emulsion Place 1 drop into both eyes 2 (two) times daily as needed.       Marland Kitchen FLUoxetine (PROZAC) 40 MG capsule Take 40 mg by mouth at bedtime.      . gabapentin (NEURONTIN) 600 MG tablet Take 1,200 mg by mouth 3 (three) times daily.      Marland Kitchen HYDROcodone-acetaminophen (NORCO/VICODIN) 5-325 MG per tablet Take 1 tablet by mouth every 6 (six) hours as needed for moderate pain.      Marland Kitchen KLOR-CON M20 20 MEQ tablet       . liothyronine (CYTOMEL) 25 MCG tablet Take 25 mcg by  mouth every morning.      Marland Kitchen losartan-hydrochlorothiazide (HYZAAR) 100-12.5 MG per tablet Take 1 tablet by mouth daily.      . Omega-3 Fatty Acids (FISH OIL) 1200 MG CAPS Take 1,200 mg by mouth daily.      . potassium chloride (KLOR-CON) 20 MEQ packet Take by mouth daily.      . vitamin B-12 (CYANOCOBALAMIN) 1000 MCG tablet Take 1,000 mcg by mouth daily.      . pregabalin (LYRICA) 75 MG capsule Take 1 capsule (75 mg total) by mouth 3 (three) times daily.  90 capsule  2   No facility-administered medications prior to visit.     PHYSICAL EXAM  Filed Vitals:   02/09/14 1047  BP: 128/76  Pulse: 67  Height: 4\' 11"   (1.499 m)  Weight: 83.371 kg (183 lb 12.8 oz)   Body mass index is 37.1 kg/(m^2). No exam data present   Generalized: Well developed, in no acute distress  Head: normocephalic and atraumatic. Oropharynx benign  Neck: Supple, no carotid bruits  Cardiac: Regular rate rhythm, no murmur  Musculoskeletal: No deformity   Neurologic Exam  Mental Status: Awake and fully alert. Oriented to place and time. Recent and remote memory intact. Attention span, concentration and fund of knowledge appropriate. Mood and affect are depressed.  Cranial Nerves: Fundoscopic exam not done. Pupils equal, briskly reactive to light. Extraocular movements full without nystagmus. Visual fields full to confrontation. Hearing intact. Facial sensation intact. Face, tongue, palate moves normally and symmetrically.  Motor: Normal bulk and tone. Normal strength in all tested extremity muscles.  Sensory: diminished touch and pinprick and vibratory sensation from ankle down bilaterally.  Coordination: Rapid alternating movements normal in all extremities. Finger-to-nose and heel-to-shin performed accurately bilaterally.  Gait and Station: Arises from chair without difficulty. Stance is broad based.uses a cane. Gait demonstrates normal stride length and mild imbalance. Not able to heel, toe and tandem walk without difficulty. Using cane. Reflexes: 1+ and symmetric except both knee and ankle jerks are absent.   ASSESSMENT AND PLAN 46 year Caucasian lady with left jaw paroxysmal neuralgic pain for 3 years likely trigeminal neuralgia affecting V 3 division. Pain has flared since ventral hernia surgery in April, 2015 suboptimally treated with Gabapentin and Lyrica.   PLAN:  I had a long discussion with the patient regarding her refractory trigeminal large pain. Since she has shown some response to Lyrica but the response is short lasting I would recommend increasing the frequency to 3 times daily. If she continues to have  unsatisfactory response may consider adding carbamazepine or baclofen later. She continues to refuse referral to neurosurgeon for gamma knife or a microvascular decompression Return for followup in 6 weeks or call earlier if necessary.                                    Return in about 6 weeks (around 03/23/2014).  Antony Contras, MD  02/09/2014, 11:13 AM [facility] [address] [place], [place] [address] [phone]  Note: This document was prepared with digital dictation and possible smart phrase technology. Any transcriptional errors that result from this process are unintentional.

## 2014-03-24 ENCOUNTER — Ambulatory Visit (INDEPENDENT_AMBULATORY_CARE_PROVIDER_SITE_OTHER): Payer: Medicare Other | Admitting: Neurology

## 2014-03-24 ENCOUNTER — Encounter (INDEPENDENT_AMBULATORY_CARE_PROVIDER_SITE_OTHER): Payer: Self-pay

## 2014-03-24 ENCOUNTER — Encounter: Payer: Self-pay | Admitting: Neurology

## 2014-03-24 VITALS — BP 131/75 | HR 59 | Wt 199.0 lb

## 2014-03-24 DIAGNOSIS — G500 Trigeminal neuralgia: Secondary | ICD-10-CM

## 2014-03-24 MED ORDER — CARBAMAZEPINE 100 MG PO CHEW: 100.0000 mg | CHEWABLE_TABLET | Freq: Three times a day (TID) | ORAL | Status: DC

## 2014-03-24 NOTE — Patient Instructions (Signed)
I again had a long discussion with the patient with regards to her refractory trigeminal neuralgic pain. She is still having suboptimal relief on maximum doses of gabapentin and Lyrica. She continues to refuse referral to neurosurgery for,gammaknife or surgical treatment. Start carbamazepine 100 mg twice daily for one week increase if tolerated to 3 times daily. I have discussed possible side effects with the patient and advised her to call me if needed. Return for followup in 2 months with Charlott Holler, nurse practitioner or call earlier if necessary.

## 2014-03-24 NOTE — Progress Notes (Signed)
Marland Kitchen      PATIENT: Kayla Payne DOB: Mar 19, 1946  REASON FOR VISIT: routine follow up for trigeminal neuralgia HISTORY FROM: patient  HISTORY OF PRESENT ILLNESS: 62 year Caucasian lady with left jaw intermittent sharp shooting pain involving left lower jaw mailnly lasting few seconds. At times and for few hours sometimes.  Pain is moderate to severe and triggered by cold exposure and used to be relieved by gabapentin which she takes 1200 mg three times daily and tolerates quite well but last 1 year pain is suboptimally relieved and more frequent.  She has done relatively well last 2 weeks with no pain. She has not tried carbamezapine, topamax or lyrica. 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  She also takes norco and vicodin for arthritic pain.She had full dental evaluation which was normal.  Update 02/09/14 : She returns for followup of her last visit the month ago. She states that Lyrica it did help her for the first to 3 weeks but now the pain seems to be coming back. She gets fairly good relief in the morning when she takes her first dose but lunchtime the pain seems to return. She is not having significant dizziness or other side effects. She has discontinue Topamax and remains on Gabapentin 1200mg  TID.  y. She continues to refuse referral to neurosurgery for, gammaknife or microvascular decompression.   Last visit 01/14/2014 : She returns for followup of the last with a month ago. She does not report significant benefit after adding Topamax which she is taking currently at the dose of 50 mg twice daily she is tolerating it well but it has not particularly helped. She remains on t Gabapentin 1200mg  three times daily. She has not tried Lyrica, carbamazepine or baclofen yet. The pain is intermittent but present every time she eats or drinks anything. Pain is severe and intolerable. I discussed with the patient surgical  treatment options  including trigeminal nerve surgery with gamma knife or microvascular decompression but she is unwilling to discuss surgical options at the present time. Update 03/24/14 : She returns for followup after last visit 6 weeks ago. She again states that she got initial relief after increasing the dose of Lyrica to 150 mg 3 times daily but for the last several weeks the pain has again in intolerable. She'll cannot identify specific triggers. She is able to do most of activities of daily level living but would like better control of her pain. She continues to refuse referral to neurosurgery. She is currently taking gabapentin 1200 mg three times daily and Lyrica 150 mg 3 times daily and tolerating it well without significant side effects. ROS:  14 system review of systems is positive for tremors, joint pain, back pain, aching muscles, restless legs, frequent waking, jaw pain, itching, depression nervous and anxiety. ALLERGIES: Allergies  Allergen Reactions  . Morphine Swelling  . Morphine And Related Other (See Comments) and Rash    HOME MEDICATIONS: Outpatient Prescriptions Prior to Visit  Medication Sig Dispense Refill  . ACCU-CHEK AVIVA PLUS test strip       . ACCU-CHEK SOFTCLIX LANCETS lancets       . amLODipine (NORVASC) 10 MG tablet       . aspirin EC 81 MG tablet Take 81 mg by mouth daily.      . Black Cohosh 40 MG CAPS Take by mouth daily.      . cycloSPORINE (RESTASIS) 0.05 % ophthalmic  emulsion Place 1 drop into both eyes 2 (two) times daily as needed.       Marland Kitchen FLUoxetine (PROZAC) 40 MG capsule Take 40 mg by mouth at bedtime.      . gabapentin (NEURONTIN) 600 MG tablet Take 1,200 mg by mouth 3 (three) times daily.      Marland Kitchen HYDROcodone-acetaminophen (NORCO/VICODIN) 5-325 MG per tablet Take 1 tablet by mouth every 6 (six) hours as needed for moderate pain.      Marland Kitchen KLOR-CON M20 20 MEQ tablet       . liothyronine (CYTOMEL) 25 MCG tablet Take 25 mcg by mouth every morning.      Marland Kitchen  losartan-hydrochlorothiazide (HYZAAR) 100-12.5 MG per tablet Take 1 tablet by mouth daily.      . Omega-3 Fatty Acids (FISH OIL) 1200 MG CAPS Take 1,200 mg by mouth daily.      . potassium chloride (KLOR-CON) 20 MEQ packet Take by mouth daily.      . pregabalin (LYRICA) 150 MG capsule Take 1 capsule (150 mg total) by mouth 3 (three) times daily.  90 capsule  2  . vitamin B-12 (CYANOCOBALAMIN) 1000 MCG tablet Take 1,000 mcg by mouth daily.       No facility-administered medications prior to visit.     PHYSICAL EXAM  Filed Vitals:   03/24/14 1447  BP: 131/75  Pulse: 59  Weight: 199 lb (90.266 kg)   Body mass index is 40.17 kg/(m^2). No exam data present   Generalized: Well developed elderly Caucasian lady, in no acute distress  Head: normocephalic and atraumatic. Oropharynx benign  Neck: Supple, no carotid bruits  Cardiac: Regular rate rhythm, no murmur  Musculoskeletal: No deformity   Neurologic Exam  Mental Status: Awake and fully alert. Oriented to place and time. Recent and remote memory intact. Attention span, concentration and fund of knowledge appropriate. Mood and affect are depressed.  Cranial Nerves: Fundoscopic exam not done. Pupils equal, briskly reactive to light. Extraocular movements full without nystagmus. Visual fields full to confrontation. Hearing intact. Facial sensation intact. Face, tongue, palate moves normally and symmetrically.  Motor: Normal bulk and tone. Normal strength in all tested extremity muscles.  Sensory: diminished touch and pinprick and vibratory sensation from ankle down bilaterally.  Coordination: Rapid alternating movements normal in all extremities. Finger-to-nose and heel-to-shin performed accurately bilaterally.  Gait and Station: Arises from chair without difficulty. Stance is broad based.uses a cane. Gait demonstrates normal stride length and mild imbalance. Not able to heel, toe and tandem walk without difficulty. Using cane. Reflexes: 1+  and symmetric except both knee and ankle jerks are absent.   ASSESSMENT AND PLAN 71 year Caucasian lady with left jaw paroxysmal neuralgic pain for 3 years likely trigeminal neuralgia affecting V 3 division. Pain has flared since ventral hernia surgery in April, 2015 suboptimally treated with maximal doses of Gabapentin and Lyrica.   PLAN:    I again had a long discussion with the patient with regards to her refractory trigeminal neuralgic pain. She is still having suboptimal relief on maximum doses of gabapentin and Lyrica. She continues to refuse referral to neurosurgery for,gammaknife or surgical treatment. Start carbamazepine 100 mg twice daily for one week increase if tolerated to 3 times daily. I have discussed possible side effects with the patient and advised her to call me if needed. Return for followup in 2 months with Charlott Holler, nurse practitioner or call earlier if necessary.  Return in about 2 months (around 05/24/2014).  Antony Contras, MD  03/24/2014, 3:25 PM [facility] [address] [place], [place] [address] [phone]  Note: This document was prepared with digital dictation and possible smart phrase technology. Any transcriptional errors that result from this process are unintentional.

## 2014-04-17 ENCOUNTER — Other Ambulatory Visit: Payer: Self-pay | Admitting: Neurology

## 2014-05-25 ENCOUNTER — Ambulatory Visit (INDEPENDENT_AMBULATORY_CARE_PROVIDER_SITE_OTHER): Payer: Medicare Other | Admitting: Nurse Practitioner

## 2014-05-25 ENCOUNTER — Encounter: Payer: Self-pay | Admitting: Nurse Practitioner

## 2014-05-25 VITALS — BP 158/85 | HR 85 | Temp 98.2°F | Ht 59.0 in | Wt 201.5 lb

## 2014-05-25 DIAGNOSIS — G500 Trigeminal neuralgia: Secondary | ICD-10-CM

## 2014-05-25 MED ORDER — PREGABALIN 150 MG PO CAPS: 150.0000 mg | ORAL_CAPSULE | Freq: Three times a day (TID) | ORAL | Status: DC

## 2014-05-25 MED ORDER — CARBAMAZEPINE 100 MG PO CHEW: CHEWABLE_TABLET | ORAL | Status: DC

## 2014-05-25 NOTE — Progress Notes (Signed)
I agree with the above plan 

## 2014-05-25 NOTE — Patient Instructions (Signed)
Continue Lyrica, Gabapentin and Tegretol at current doses.  We can try to wean the Tegretol in the future, but we want to continue on this dose for several months to ensure that the pain is controlled.  Follow up in 6 months, sooner as needed.     Trigeminal Neuralgia Trigeminal neuralgia is a nerve disorder that causes sudden attacks of severe facial pain. It is caused by damage to the trigeminal nerve, a major nerve in the face. It is more common in women and in the elderly, although it can also happen in younger patients. Attacks last from a few seconds to several minutes and can occur from a couple of times per year to several times per day. Trigeminal neuralgia can be a very distressing and disabling condition. Surgery may be needed in very severe cases if medical treatment does not give relief. HOME CARE INSTRUCTIONS   If your caregiver prescribed medication to help prevent attacks, take as directed.  To help prevent attacks:  Chew on the unaffected side of the mouth.  Avoid touching your face.  Avoid blasts of hot or cold air.  Men may wish to grow a beard to avoid having to shave. SEEK IMMEDIATE MEDICAL CARE IF:  Pain is unbearable and your medicine does not help.  You develop new, unexplained symptoms (problems).  You have problems that may be related to a medication you are taking. Document Released: 06/23/2000 Document Revised: 09/18/2011 Document Reviewed: 04/23/2009 [facility] Patient Information 2015 [place], [place]. This information is not intended to replace advice given to you by your health care provider. Make sure you discuss any questions you have with your health care provider.

## 2014-05-25 NOTE — Progress Notes (Signed)
PATIENT: Kayla Payne DOB: Apr 03, 1946  REASON FOR VISIT: routine follow up for for trigeminal neuralgia HISTORY FROM: patient  HISTORY OF PRESENT ILLNESS: 20 year Caucasian lady with left jaw intermittent sharp shooting pain involving left lower jaw mailnly lasting few seconds. At times and for few hours sometimes.  Pain is moderate to severe and triggered by cold exposure and used to be relieved by gabapentin which she takes 1200 mg three times daily and tolerates quite well but last 1 year pain is suboptimally relieved and more frequent.  She has done relatively well last 2 weeks with no pain. She has not tried carbamezapine, topamax or lyrica. 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  She also takes norco and vicodin for arthritic pain.She had full dental evaluation which was normal.  Update 02/09/14: She returns for followup of her last visit the month ago. She states that Lyrica it did help her for the first to 3 weeks but now the pain seems to be coming back. She gets fairly good relief in the morning when she takes her first dose but lunchtime the pain seems to return. She is not having significant dizziness or other side effects. She has discontinue Topamax and remains on Gabapentin 1200mg  TID.  y. She continues to refuse referral to neurosurgery for, gammaknife or microvascular decompression. Last visit 01/14/2014 : She returns for followup of the last with a month ago. She does not report significant benefit after adding Topamax which she is taking currently at the dose of 50 mg twice daily she is tolerating it well but it has not particularly helped. She remains on t Gabapentin 1200mg  three times daily. She has not tried Lyrica, carbamazepine or baclofen yet. The pain is intermittent but present every time she eats or drinks anything. Pain is severe and intolerable. I discussed with the patient surgical  treatment options including  trigeminal nerve surgery with gamma knife or microvascular decompression but she is unwilling to discuss surgical options at the present time. Update 03/24/14 : She returns for followup after last visit 6 weeks ago. She again states that she got initial relief after increasing the dose of Lyrica to 150 mg 3 times daily but for the last several weeks the pain has again in intolerable. She'll cannot identify specific triggers. She is able to do most of activities of daily level living but would like better control of her pain. She continues to refuse referral to neurosurgery. She is currently taking gabapentin 1200 mg three times daily and Lyrica 150 mg 3 times daily and tolerating it well without significant side effects.  Update 05/25/14 (LL):  She returns for followup after last visit 2 months ago. Since starting Carbamazepine 100 mg 3 times daily her trigeminal pain is relieved. She is currently also taking gabapentin 1200 mg three times daily and Lyrica 150 mg 3 times daily and tolerating it well without significant side effects.  ROS:  14 system review of systems is positive for joint pain, joint swelling, back pain, aching muscles, walking difficulty, restless legs, frequent waking, jaw pain, depression nervous and anxiety.   ALLERGIES: Allergies  Allergen Reactions  . Morphine Swelling  . Morphine And Related Other (See Comments) and Rash    HOME MEDICATIONS: Outpatient Prescriptions Prior to Visit  Medication Sig Dispense Refill  . ACCU-CHEK AVIVA PLUS test strip     . ACCU-CHEK SOFTCLIX LANCETS lancets     .  amLODipine (NORVASC) 10 MG tablet     . aspirin EC 81 MG tablet Take 81 mg by mouth daily.    . Black Cohosh 40 MG CAPS Take by mouth daily.    . cycloSPORINE (RESTASIS) 0.05 % ophthalmic emulsion Place 1 drop into both eyes 2 (two) times daily as needed.     Marland Kitchen FLUoxetine (PROZAC) 40 MG capsule Take 40 mg by mouth at bedtime.    . gabapentin (NEURONTIN) 600 MG tablet Take 1,200  mg by mouth 3 (three) times daily.    Marland Kitchen KLOR-CON M20 20 MEQ tablet     . liothyronine (CYTOMEL) 25 MCG tablet Take 25 mcg by mouth every morning.    Marland Kitchen losartan-hydrochlorothiazide (HYZAAR) 100-12.5 MG per tablet Take 1 tablet by mouth daily.    . Omega-3 Fatty Acids (FISH OIL) 1200 MG CAPS Take 1,200 mg by mouth daily.    . carbamazepine (TEGRETOL) 100 MG chewable tablet CHEW AND SWALLOW ONE TABLET TWICE DAILY FOR 7 DAYS THEN INCREASE TO  ONE TABLET THREE TIMES DAILY 90 tablet 3  . HYDROcodone-acetaminophen (NORCO/VICODIN) 5-325 MG per tablet Take 1 tablet by mouth every 6 (six) hours as needed for moderate pain.    . potassium chloride (KLOR-CON) 20 MEQ packet Take by mouth daily.    . pregabalin (LYRICA) 150 MG capsule Take 1 capsule (150 mg total) by mouth 3 (three) times daily. 90 capsule 2  . vitamin B-12 (CYANOCOBALAMIN) 1000 MCG tablet Take 1,000 mcg by mouth daily.     No facility-administered medications prior to visit.    PHYSICAL EXAM Filed Vitals:   05/25/14 0919  BP: 158/85  Pulse: 85  Temp: 98.2 F (36.8 C)  Height: 4\' 11"  (1.499 m)  Weight: 201 lb 8 oz (91.4 kg)   Body mass index is 40.68 kg/(m^2).  Generalized: Well developed elderly Caucasian lady, in no acute distress   Head: normocephalic and atraumatic. Oropharynx benign   Neck: Supple, no carotid bruits   Cardiac: Regular rate rhythm, no murmur   Musculoskeletal: No deformity   Neurologic Exam   Mental Status: Awake and fully alert. Oriented to place and time. Recent and remote memory intact. Attention span, concentration and fund of knowledge appropriate. Mood and affect are depressed.   Cranial Nerves: Fundoscopic exam not done. Pupils equal, briskly reactive to light. Extraocular movements full without nystagmus. Visual fields full to confrontation. Hearing intact. Facial sensation intact. Face, tongue, palate moves normally and symmetrically.   Motor: Normal bulk and tone. Normal strength in all tested  extremity muscles.   Sensory: diminished touch and pinprick and vibratory sensation from ankle down bilaterally.   Coordination: Rapid alternating movements normal in all extremities. Finger-to-nose and heel-to-shin performed accurately bilaterally.   Gait and Station: Arises from chair without difficulty. Stance is broad based.uses a cane. Gait demonstrates normal stride length and mild imbalance. Not able to heel, toe and tandem walk without difficulty. Using cane. Reflexes: 1+ and symmetric except both knee and ankle jerks are absent.   ASSESSMENT: 55 year Caucasian lady with left jaw paroxysmal neuralgic pain for 3 years likely trigeminal neuralgia affecting V 3 division. Pain has flared since ventral hernia surgery in April 2015, suboptimally treated with maximal doses of Gabapentin and Lyrica. Now controlled with the addition of Carbamazepine.  PLAN:  I had a long discussion with the patient with regards to her refractory trigeminal neuralgic pain. She had suboptimal relief on maximum doses of gabapentin and Lyrica. Continue carbamazepine 100 mg three  times daily.  We can try to wean this dose in the future if possible. I have discussed possible side effects with the patient and advised her to call me if needed. Return for followup in 6 months with nurse practitioner or call earlier if necessary.  Orders Placed This Encounter  Procedures  . Carbamazepine level, total   Meds ordered this encounter  Medications  . pregabalin (LYRICA) 150 MG capsule    Sig: Take 1 capsule (150 mg total) by mouth 3 (three) times daily.    Dispense:  90 capsule    Refill:  5    Order Specific Question:  Supervising Provider    Answer:  Leonie Man, PRAMOD [[id number]]  . carbamazepine (TEGRETOL) 100 MG chewable tablet    Sig: ONE TABLET THREE TIMES DAILY    Dispense:  90 tablet    Refill:  5    Order Specific Question:  Supervising Provider    Answer:  Antony Contras [[id number]]   Return in about 6 months (around  11/23/2014) for trigeminal neuralgia.  Rudi Rummage Danette Weinfeld, MSN, FNP-BC, A/GNP-C 05/25/2014, 9:48 AM [facility] [address], [place], [place] [address] [phone]  Note: This document was prepared with digital dictation and possible smart phrase technology. Any transcriptional errors that result from this process are unintentional.

## 2014-05-26 ENCOUNTER — Telehealth: Payer: Self-pay | Admitting: *Deleted

## 2014-05-26 LAB — CARBAMAZEPINE LEVEL, TOTAL: Carbamazepine Lvl: 6.2 ug/mL (ref 4.0–12.0)

## 2014-05-26 NOTE — Telephone Encounter (Signed)
-----   Message from Philmore Pali, NP sent at 05/26/2014  8:32 AM EST ----- Carbamazepine level is good, please call patient.

## 2014-05-26 NOTE — Telephone Encounter (Signed)
Spoke with patient and informed her of normal Carbamazepine levels, patient verbalized understanding and had no further questions or concerns.

## 2014-09-29 DIAGNOSIS — I10 Essential (primary) hypertension: Secondary | ICD-10-CM | POA: Diagnosis not present

## 2014-09-29 DIAGNOSIS — E782 Mixed hyperlipidemia: Secondary | ICD-10-CM | POA: Diagnosis not present

## 2014-09-29 DIAGNOSIS — E1122 Type 2 diabetes mellitus with diabetic chronic kidney disease: Secondary | ICD-10-CM | POA: Diagnosis not present

## 2014-09-29 DIAGNOSIS — E6609 Other obesity due to excess calories: Secondary | ICD-10-CM | POA: Diagnosis not present

## 2014-09-29 DIAGNOSIS — E1165 Type 2 diabetes mellitus with hyperglycemia: Secondary | ICD-10-CM | POA: Diagnosis not present

## 2014-10-06 DIAGNOSIS — G500 Trigeminal neuralgia: Secondary | ICD-10-CM | POA: Diagnosis not present

## 2014-10-06 DIAGNOSIS — E1122 Type 2 diabetes mellitus with diabetic chronic kidney disease: Secondary | ICD-10-CM | POA: Diagnosis not present

## 2014-10-06 DIAGNOSIS — F331 Major depressive disorder, recurrent, moderate: Secondary | ICD-10-CM | POA: Diagnosis not present

## 2014-10-06 DIAGNOSIS — E6609 Other obesity due to excess calories: Secondary | ICD-10-CM | POA: Diagnosis not present

## 2014-10-06 DIAGNOSIS — E782 Mixed hyperlipidemia: Secondary | ICD-10-CM | POA: Diagnosis not present

## 2014-10-26 ENCOUNTER — Telehealth: Payer: Self-pay | Admitting: *Deleted

## 2014-10-26 NOTE — Telephone Encounter (Signed)
Called and left a message for the pt asking her to call back and reschedule her appt. Dr. Leonie Man will not be in the office 11/30/14. When she calls back, please reschedule

## 2014-11-30 ENCOUNTER — Ambulatory Visit: Payer: Medicare Other | Admitting: Neurology

## 2014-12-18 ENCOUNTER — Other Ambulatory Visit: Payer: Self-pay | Admitting: Nurse Practitioner

## 2015-01-12 ENCOUNTER — Encounter: Payer: Self-pay | Admitting: Neurology

## 2015-01-12 ENCOUNTER — Ambulatory Visit (INDEPENDENT_AMBULATORY_CARE_PROVIDER_SITE_OTHER): Payer: Medicare Other | Admitting: Neurology

## 2015-01-12 ENCOUNTER — Other Ambulatory Visit: Payer: Self-pay

## 2015-01-12 VITALS — BP 156/92 | HR 72 | Ht 59.0 in | Wt 222.0 lb

## 2015-01-12 DIAGNOSIS — G500 Trigeminal neuralgia: Secondary | ICD-10-CM | POA: Diagnosis not present

## 2015-01-12 MED ORDER — PREGABALIN 150 MG PO CAPS: 150.0000 mg | ORAL_CAPSULE | Freq: Three times a day (TID) | ORAL | Status: DC

## 2015-01-12 NOTE — Progress Notes (Signed)
PATIENT: Kayla Payne DOB: [birth year]  REASON FOR VISIT: routine follow up for for trigeminal neuralgia HISTORY FROM: patient  HISTORY OF PRESENT ILLNESS: 73 year Caucasian lady with left jaw intermittent sharp shooting pain involving left lower jaw mailnly lasting few seconds. At times and for few hours sometimes.  Pain is moderate to severe and triggered by cold exposure and used to be relieved by gabapentin which she takes 1200 mg three times daily and tolerates quite well but last 1 year pain is suboptimally relieved and more frequent.  She has done relatively well last 2 weeks with no pain. She has not tried carbamezapine, topamax or lyrica. She had MRI brain 01/06/13 and mra brain at 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  She also takes norco and vicodin for arthritic pain.She had full dental evaluation which was normal.  Update 02/09/14: She returns for followup of her last visit the month ago. She states that Lyrica it did help her for the first to 3 weeks but now the pain seems to be coming back. She gets fairly good relief in the morning when she takes her first dose but lunchtime the pain seems to return. She is not having significant dizziness or other side effects. She has discontinue Topamax and remains on Gabapentin 1200mg  TID.  y. She continues to refuse referral to neurosurgery for, gammaknife or microvascular decompression. Last visit 01/14/2014 : She returns for followup of the last with a month ago. She does not report significant benefit after adding Topamax which she is taking currently at the dose of 50 mg twice daily she is tolerating it well but it has not particularly helped. She remains on t Gabapentin 1200mg  three times daily. She has not tried Lyrica, carbamazepine or baclofen yet. The pain is intermittent but present every time she eats or drinks anything. Pain is severe and intolerable. I discussed with the patient surgical  treatment options including  trigeminal nerve surgery with gamma knife or microvascular decompression but she is unwilling to discuss surgical options at the present time. Update 03/24/14 : She returns for followup after last visit 6 weeks ago. She again states that she got initial relief after increasing the dose of Lyrica to 150 mg 3 times daily but for the last several weeks the pain has again in intolerable. She'll cannot identify specific triggers. She is able to do most of activities of daily level living but would like better control of her pain. She continues to refuse referral to neurosurgery. She is currently taking gabapentin 1200 mg three times daily and Lyrica 150 mg 3 times daily and tolerating it well without significant side effects.  Update 05/25/14 (LL):  She returns for followup after last visit 2 months ago. Since starting Carbamazepine 100 mg 3 times daily her trigeminal pain is relieved. She is currently also taking gabapentin 1200 mg three times daily and Lyrica 150 mg 3 times daily and tolerating it well without significant side effects. Update 01/12/2015 : She returns for follow-up after last visit the sixth once ago. She states her trigeminal neuralgic pain continues to be well controlled on the current medication regimen of carbamazepine 100 mg 3 times daily as well as gabapentin 303 times daily and Benicar 153 times daily. She still gets the pain around once a day or so but last only a few seconds. It is often triggered by exposure to cold water or food. She has not had any new health problems or any hospital  admissions. She is tolerating the current medication regimen without significant side effects. She has no new complaints. ROS:  14 system review of systems is positive for joint pain, joint swelling, back pain, aching muscles, walking difficulty, restless legs, frequent waking, jaw pain, depression nervous, itching, runny nose, tremors, depression and anxiety.   ALLERGIES: Allergies  Allergen Reactions    . Morphine Swelling  . Morphine And Related Other (See Comments) and Rash    HOME MEDICATIONS: Outpatient Prescriptions Prior to Visit  Medication Sig Dispense Refill  . ACCU-CHEK AVIVA PLUS test strip     . ACCU-CHEK SOFTCLIX LANCETS lancets     . amLODipine (NORVASC) 10 MG tablet     . aspirin EC 81 MG tablet Take 81 mg by mouth daily.    . Black Cohosh 40 MG CAPS Take by mouth daily.    . carbamazepine (TEGRETOL) 100 MG chewable tablet ONE TABLET THREE TIMES DAILY 90 tablet 5  . cycloSPORINE (RESTASIS) 0.05 % ophthalmic emulsion Place 1 drop into both eyes 2 (two) times daily as needed.     Marland Kitchen FLUoxetine (PROZAC) 40 MG capsule Take 40 mg by mouth at bedtime.    Marland Kitchen HYDROcodone-acetaminophen (NORCO) 10-325 MG per tablet     . KLOR-CON M20 20 MEQ tablet     . liothyronine (CYTOMEL) 25 MCG tablet Take 25 mcg by mouth every morning.    Marland Kitchen losartan-hydrochlorothiazide (HYZAAR) 100-12.5 MG per tablet Take 1 tablet by mouth daily.    Marland Kitchen LYRICA 150 MG capsule TAKE ONE CAPSULE BY MOUTH THREE TIMES DAILY 90 capsule 0  . Omega-3 Fatty Acids (FISH OIL) 1200 MG CAPS Take 1,200 mg by mouth daily.    . vitamin B-12 (CYANOCOBALAMIN) 1000 MCG tablet Take 1,000 mcg by mouth daily.    Marland Kitchen gabapentin (NEURONTIN) 600 MG tablet Take 1,200 mg by mouth 3 (three) times daily.     No facility-administered medications prior to visit.    PHYSICAL EXAM Filed Vitals:   01/12/15 0934  BP: 156/92  Pulse: 72  Height: 4\' 11"  (1.499 m)  Weight: 222 lb (100.699 kg)   Body mass index is 44.81 kg/(m^2).  Generalized: Well developed elderly Caucasian lady, in no acute distress   Head: normocephalic and atraumatic.   Neck: Supple, no carotid bruits   Cardiac: Regular rate rhythm, no murmur   Musculoskeletal: No deformity   Neurologic Exam   Mental Status: Awake and fully alert. Oriented to place and time. Recent and remote memory intact. Attention span, concentration and fund of knowledge appropriate. Mood and  affect are depressed.   Cranial Nerves: Fundoscopic exam not done. Pupils equal, briskly reactive to light. Extraocular movements full without nystagmus. Visual fields full to confrontation. Hearing intact. Facial sensation intact. Face, tongue, palate moves normally and symmetrically.   Motor: Normal bulk and tone. Normal strength in all tested extremity muscles.   Sensory: diminished touch and pinprick and vibratory sensation from ankle down bilaterally.   Coordination: Rapid alternating movements normal in all extremities. Finger-to-nose and heel-to-shin performed accurately bilaterally.   Gait and Station: Arises from chair without difficulty. Stance is broad based.uses a cane. Gait demonstrates normal stride length and mild imbalance. Not able to heel, toe and tandem walk without difficulty. Using cane. Reflexes: 1+ and symmetric except both knee and ankle jerks are absent.   ASSESSMENT: 71 year Caucasian lady with left jaw paroxysmal neuralgic pain for 3 years likely trigeminal neuralgia affecting V 3 division.  . Now controlled with the addition  of small dose of Carbamazepine to   Lyrica and Neurontin.  PLAN:   I had a long discussion with the patient with regards to her trigeminal neuralgia pain which appears to be adequately controlled on the present medication regime. I advised her to avoid pain triggers like exposure to cold .Continue carbamazepine 100 mg 3 times daily as well as Neurontin and Lyrica in the current dosages. Check CBC and LFTs. Return for follow-up in 6 months with Ward Givens, NP or call earlier if necessary.  Orders Placed This Encounter  Procedures  . CBC (no diff)  . Hepatic function panel   Meds ordered this encounter  Medications  . pregabalin (LYRICA) 150 MG capsule    Sig: Take 1 capsule (150 mg total) by mouth 3 (three) times daily.    Dispense:  90 capsule    Refill:  5    Order Specific Question:  Supervising Provider    Answer:  Leonie Man, Martha Ellerby [[id number]]   . carbamazepine (TEGRETOL) 100 MG chewable tablet    Sig: ONE TABLET THREE TIMES DAILY    Dispense:  90 tablet    Refill:  5    Order Specific Question:  Supervising Provider    Answer:  Antony Contras [[id number]]   Return in about 6 months (around 07/15/2015).   Antony Contras, MD  01/12/2015, 10:06 AM [facility] [address] [place], [place] [address] [phone]  Note: This document was prepared with digital dictation and possible smart phrase technology. Any transcriptional errors that result from this process are unintentional.

## 2015-01-12 NOTE — Patient Instructions (Addendum)
I had a long discussion with the patient with regards to her trigeminal neuralgia pain which appears to be adequately controlled on the present medication regime. I advised her to avoid pain triggers like exposure to cold .Continue carbamazepine 100 mg 3 times daily as well as Neurontin and Lyrica in the current dosages. Check CBC and LFTs. Return for follow-up in 6 months with Ward Givens, NP or call earlier if necessary.  Trigeminal Neuralgia Trigeminal neuralgia is a nerve disorder that causes sudden attacks of severe facial pain. It is caused by damage to the trigeminal nerve, a major nerve in the face. It is more common in women and in the elderly, although it can also happen in younger patients. Attacks last from a few seconds to several minutes and can occur from a couple of times per year to several times per day. Trigeminal neuralgia can be a very distressing and disabling condition. Surgery may be needed in very severe cases if medical treatment does not give relief. HOME CARE INSTRUCTIONS   If your caregiver prescribed medication to help prevent attacks, take as directed.  To help prevent attacks:  Chew on the unaffected side of the mouth.  Avoid touching your face.  Avoid blasts of hot or cold air.  Men may wish to grow a beard to avoid having to shave. SEEK IMMEDIATE MEDICAL CARE IF:  Pain is unbearable and your medicine does not help.  You develop new, unexplained symptoms (problems).  You have problems that may be related to a medication you are taking. Document Released: 06/23/2000 Document Revised: 09/18/2011 Document Reviewed: 04/23/2009 [facility] Patient Information 2015 [place], [place]. This information is not intended to replace advice given to you by your health care provider. Make sure you discuss any questions you have with your health care provider.

## 2015-01-12 NOTE — Telephone Encounter (Signed)
Rx signed and faxed.

## 2015-01-13 LAB — HEPATIC FUNCTION PANEL
ALBUMIN: 3.7 g/dL (ref 3.6–4.8)
ALK PHOS: 127 IU/L — AB (ref 39–117)
ALT: 10 IU/L (ref 0–32)
AST: 10 IU/L (ref 0–40)
BILIRUBIN, DIRECT: 0.08 mg/dL (ref 0.00–0.40)
Bilirubin Total: <0.2 mg/dL (ref 0.0–1.2)
Total Protein: 6.8 g/dL (ref 6.0–8.5)

## 2015-01-13 LAB — CBC
Hematocrit: 39.4 % (ref 34.0–46.6)
Hemoglobin: 13.3 g/dL (ref 11.1–15.9)
MCH: 31.7 pg (ref 26.6–33.0)
MCHC: 33.8 g/dL (ref 31.5–35.7)
MCV: 94 fL (ref 79–97)
PLATELETS: 201 10*3/uL (ref 150–379)
RBC: 4.2 x10E6/uL (ref 3.77–5.28)
RDW: 14.3 % (ref 12.3–15.4)
WBC: 6.4 10*3/uL (ref 3.4–10.8)

## 2015-02-10 DIAGNOSIS — E6609 Other obesity due to excess calories: Secondary | ICD-10-CM | POA: Diagnosis not present

## 2015-02-10 DIAGNOSIS — I10 Essential (primary) hypertension: Secondary | ICD-10-CM | POA: Diagnosis not present

## 2015-02-10 DIAGNOSIS — E1122 Type 2 diabetes mellitus with diabetic chronic kidney disease: Secondary | ICD-10-CM | POA: Diagnosis not present

## 2015-02-10 DIAGNOSIS — E782 Mixed hyperlipidemia: Secondary | ICD-10-CM | POA: Diagnosis not present

## 2015-02-10 DIAGNOSIS — F331 Major depressive disorder, recurrent, moderate: Secondary | ICD-10-CM | POA: Diagnosis not present

## 2015-02-18 DIAGNOSIS — E782 Mixed hyperlipidemia: Secondary | ICD-10-CM | POA: Diagnosis not present

## 2015-02-18 DIAGNOSIS — E1122 Type 2 diabetes mellitus with diabetic chronic kidney disease: Secondary | ICD-10-CM | POA: Diagnosis not present

## 2015-02-18 DIAGNOSIS — F331 Major depressive disorder, recurrent, moderate: Secondary | ICD-10-CM | POA: Diagnosis not present

## 2015-02-18 DIAGNOSIS — G500 Trigeminal neuralgia: Secondary | ICD-10-CM | POA: Diagnosis not present

## 2015-02-18 DIAGNOSIS — E6609 Other obesity due to excess calories: Secondary | ICD-10-CM | POA: Diagnosis not present

## 2015-07-15 ENCOUNTER — Encounter: Payer: Self-pay | Admitting: Adult Health

## 2015-07-15 ENCOUNTER — Ambulatory Visit (INDEPENDENT_AMBULATORY_CARE_PROVIDER_SITE_OTHER): Payer: Medicare Other | Admitting: Adult Health

## 2015-07-15 VITALS — BP 134/80 | HR 64 | Ht 59.0 in | Wt 230.0 lb

## 2015-07-15 DIAGNOSIS — Z5181 Encounter for therapeutic drug level monitoring: Secondary | ICD-10-CM

## 2015-07-15 DIAGNOSIS — G500 Trigeminal neuralgia: Secondary | ICD-10-CM | POA: Diagnosis not present

## 2015-07-15 NOTE — Progress Notes (Signed)
PATIENT: Kayla Payne DOB: 1946/05/26  REASON FOR VISIT: follow up- trigeminal neuralgia HISTORY FROM: patient  HISTORY OF PRESENT ILLNESS: Kayla Payne is a 70 year old female with a history of trigeminal neuralgia. She returns today for follow-up. The patient continues to take carbamazepine 100 mg 3 times a day. She also takes gabapentin 600 mg 3 times a day. Patient reports that she is not had any facial pain. Denies any problems eating or chewing. Overall she feels that she is doing well. She returns today for an evaluation.  HISTORY (Kayla Payne):67 year Caucasian lady with left jaw intermittent sharp shooting pain involving left lower jaw mailnly lasting few seconds. At times and for few hours sometimes. Pain is moderate to severe and triggered by cold exposure and used to be relieved by gabapentin which she takes 1200 mg three times daily and tolerates quite well but last 1 year pain is suboptimally relieved and more frequent. She has done relatively well last 2 weeks with no pain. She has not tried carbamezapine, topamax or lyrica. 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 She also takes norco and vicodin for arthritic pain.She had full dental evaluation which was normal.  Update 02/09/14: She returns for followup of her last visit the month ago. She states that Lyrica it did help her for the first to 3 weeks but now the pain seems to be coming back. She gets fairly good relief in the morning when she takes her first dose but lunchtime the pain seems to return. She is not having significant dizziness or other side effects. She has discontinue Topamax and remains on Gabapentin 1200mg  TID. y. She continues to refuse referral to neurosurgery for, gammaknife or microvascular decompression. Last visit 01/14/2014 : She returns for followup of the last with a month ago. She does not report significant benefit after adding Topamax which she is  taking currently at the dose of 50 mg twice daily she is tolerating it well but it has not particularly helped. She remains on t Gabapentin 1200mg  three times daily. She has not tried Lyrica, carbamazepine or baclofen yet. The pain is intermittent but present every time she eats or drinks anything. Pain is severe and intolerable. I discussed with the patient surgical treatment options including trigeminal nerve surgery with gamma knife or microvascular decompression but she is unwilling to discuss surgical options at the present time. Update 03/24/14 : She returns for followup after last visit 6 weeks ago. She again states that she got initial relief after increasing the dose of Lyrica to 150 mg 3 times daily but for the last several weeks the pain has again in intolerable. She'll cannot identify specific triggers. She is able to do most of activities of daily level living but would like better control of her pain. She continues to refuse referral to neurosurgery. She is currently taking gabapentin 1200 mg three times daily and Lyrica 150 mg 3 times daily and tolerating it well without significant side effects.  Update 05/25/14 (LL): She returns for followup after last visit 2 months ago. Since starting Carbamazepine 100 mg 3 times daily her trigeminal pain is relieved. She is currently also taking gabapentin 1200 mg three times daily and Lyrica 150 mg 3 times daily and tolerating it well without significant side effects. Update 01/12/2015 : She returns for follow-up after last visit the sixth once ago. She states her trigeminal neuralgic pain continues to be well controlled on  the current medication regimen of carbamazepine 100 mg 3 times daily as well as gabapentin 303 times daily and Benicar 153 times daily. She still gets the pain around once a day or so but last only a few seconds. It is often triggered by exposure to cold water or food. She has not had any new health problems or any hospital admissions. She  is tolerating the current medication regimen without significant side effects. She has no new complaints  REVIEW OF SYSTEMS: Out of a complete 14 system review of symptoms, the patient complains only of the following symptoms, and all other reviewed systems are negative.  Incontinence of bladder, joint pain, back pain, walking difficulty, frequent waking, restless leg, depression, nervous/anxious  ALLERGIES: Allergies  Allergen Reactions  . Morphine Swelling  . Morphine And Related Other (See Comments) and Rash    HOME MEDICATIONS: Outpatient Prescriptions Prior to Visit  Medication Sig Dispense Refill  . ACCU-CHEK AVIVA PLUS test strip     . ACCU-CHEK SOFTCLIX LANCETS lancets     . amLODipine (NORVASC) 10 MG tablet     . aspirin EC 81 MG tablet Take 81 mg by mouth daily.    . Black Cohosh 40 MG CAPS Take by mouth daily.    . carbamazepine (TEGRETOL) 100 MG chewable tablet ONE TABLET THREE TIMES DAILY 90 tablet 5  . CINNAMON PO Take 2 tablets by mouth daily.    . cycloSPORINE (RESTASIS) 0.05 % ophthalmic emulsion Place 1 drop into both eyes 2 (two) times daily as needed.     Marland Kitchen FLUoxetine (PROZAC) 40 MG capsule Take 40 mg by mouth at bedtime.    . gabapentin (NEURONTIN) 300 MG capsule Take 2 capsules by mouth 3 (three) times daily.    Marland Kitchen HYDROcodone-acetaminophen (NORCO) 10-325 MG per tablet     . KLOR-CON M20 20 MEQ tablet     . liothyronine (CYTOMEL) 25 MCG tablet Take 25 mcg by mouth every morning.    Marland Kitchen losartan-hydrochlorothiazide (HYZAAR) 100-12.5 MG per tablet Take 1 tablet by mouth daily.    . Magnesium 250 MG TABS Take 1 tablet by mouth daily.    . Omega-3 Fatty Acids (FISH OIL) 1200 MG CAPS Take 1,200 mg by mouth daily.    . pregabalin (LYRICA) 150 MG capsule Take 1 capsule (150 mg total) by mouth 3 (three) times daily. 90 capsule 5  . vitamin B-12 (CYANOCOBALAMIN) 1000 MCG tablet Take 1,000 mcg by mouth daily.    . vitamin C (ASCORBIC ACID) 500 MG tablet Take 1,000 mg by  mouth daily.     No facility-administered medications prior to visit.    PAST MEDICAL HISTORY: Past Medical History  Diagnosis Date  . Hypertension   . Depression   . Neuralgia     trimenial  . Osteoarthritis   . Shortness of breath   . Diabetes mellitus without complication ([place])     PAST SURGICAL HISTORY: Past Surgical History  Procedure Laterality Date  . Cesarean section      x2  . Cholecystectomy  2000    [place]  . Hernia repair  2000, 10/28/13    right and left inguinal-[facility]  . Fracture surgery      skull fracture repair-behind left ear  . Exploration middle ear      hearing loss- with surgery. subsequently found skull fracture as reason for hearing loss.  . Cataract extraction w/phaco  05/16/2012    Procedure: CATARACT EXTRACTION PHACO AND INTRAOCULAR LENS PLACEMENT (IOC);  Surgeon:  Tonny Branch, MD;  Location: [facility];  Service: Ophthalmology;  Laterality: Left;  CDE 9.31  . Cataract extraction w/phaco Right 08/19/2012    Procedure: CATARACT EXTRACTION PHACO AND INTRAOCULAR LENS PLACEMENT (IOC);  Surgeon: Tonny Branch, MD;  Location: [facility];  Service: Ophthalmology;  Laterality: Right;  CDE: 9.46    FAMILY HISTORY: Family History  Problem Relation Age of Onset  . Hyperlipidemia Mother   . Hypertension Mother   . Diabetes Mother   . Hyperlipidemia Father   . Hypertension Father     SOCIAL HISTORY: Social History   Social History  . Marital Status: Legally Separated    Spouse Name: N/A  . Number of Children: 2  . Years of Education: 12   Occupational History  . unemployed    Social History Main Topics  . Smoking status: Never Smoker   . Smokeless tobacco: Never Used  . Alcohol Use: No  . Drug Use: No  . Sexual Activity: No   Other Topics Concern  . Not on file   Social History Narrative   Patient lives at home with her boyfriend.   Caffeine Use: couple of servings of tea daily   Patient have 2 children.    Patient has a high school education.     Patient is not currently working.   Patient is right handed.            PHYSICAL EXAM  Filed Vitals:   07/15/15 0956  BP: 134/80  Pulse: 64  Height: 4\' 11"  (1.499 m)  Weight: 230 lb (104.327 kg)   Body mass index is 46.43 kg/(m^2).  Generalized: Well developed, in no acute distress   Neurological examination  Mentation: Alert oriented to time, place, history taking. Follows all commands speech and language fluent Cranial nerve II-XII: Pupils were equal round reactive to light. Extraocular movements were full, visual field were full on confrontational test. Facial sensation and strength were normal. Uvula tongue midline. Head turning and shoulder shrug  were normal and symmetric. Motor: The motor testing reveals 5 over 5 strength of all 4 extremities. Good symmetric motor tone is noted throughout.  Sensory: Sensory testing is intact to soft touch on all 4 extremities. No evidence of extinction is noted.  Coordination: Cerebellar testing reveals good finger-nose-finger and heel-to-shin bilaterally.  Gait and station: Patient has a stooped posture. Gait is slightly unsteady. Reflexes: Deep tendon reflexes are symmetric and normal bilaterally.   DIAGNOSTIC DATA (LABS, IMAGING, TESTING) - I reviewed patient records, labs, notes, testing and imaging myself where available.   ASSESSMENT AND PLAN 70 y.o. year old female  has a past medical history of Hypertension; Depression; Neuralgia; Osteoarthritis; Shortness of breath; and Diabetes mellitus without complication ([place]). here with:  1. Trigeminal neuralgia  Overall the patient is doing well. She will continue on carbamazepine and gabapentin. I will check blood work today. Patient advised that if her symptoms worsen or she develops any new symptoms she should let us know. She will follow-up in 6 months or sooner if needed.  Ward Givens, MSN, NP-C 07/15/2015, 10:26 AM [facility] [address], [place], [place] [address] [phone]

## 2015-07-15 NOTE — Patient Instructions (Signed)
Continue Carbamazepine and gabapentin I will check blood work today If your symptoms worsen or you develop new symptoms please let us know.

## 2015-07-16 ENCOUNTER — Telehealth: Payer: Self-pay | Admitting: Adult Health

## 2015-07-16 LAB — COMPREHENSIVE METABOLIC PANEL
ALBUMIN: 3.8 g/dL (ref 3.6–4.8)
ALT: 12 IU/L (ref 0–32)
AST: 12 IU/L (ref 0–40)
Albumin/Globulin Ratio: 1.2 (ref 1.1–2.5)
Alkaline Phosphatase: 128 IU/L — ABNORMAL HIGH (ref 39–117)
BUN / CREAT RATIO: 19 (ref 11–26)
BUN: 20 mg/dL (ref 8–27)
Bilirubin Total: 0.3 mg/dL (ref 0.0–1.2)
CALCIUM: 9 mg/dL (ref 8.7–10.3)
CO2: 25 mmol/L (ref 18–29)
CREATININE: 1.08 mg/dL — AB (ref 0.57–1.00)
Chloride: 101 mmol/L (ref 96–106)
GFR calc Af Amer: 61 mL/min/{1.73_m2} (ref >59)
GFR, EST NON AFRICAN AMERICAN: 53 mL/min/{1.73_m2} — AB (ref >59)
GLOBULIN, TOTAL: 3.1 g/dL (ref 1.5–4.5)
GLUCOSE: 114 mg/dL — AB (ref 65–99)
Potassium: 4.4 mmol/L (ref 3.5–5.2)
SODIUM: 142 mmol/L (ref 134–144)
Total Protein: 6.9 g/dL (ref 6.0–8.5)

## 2015-07-16 LAB — CBC WITH DIFFERENTIAL/PLATELET
BASOS: 0 %
Basophils Absolute: 0 10*3/uL (ref 0.0–0.2)
EOS (ABSOLUTE): 0.2 10*3/uL (ref 0.0–0.4)
Eos: 2 %
HEMATOCRIT: 42.3 % (ref 34.0–46.6)
Hemoglobin: 14 g/dL (ref 11.1–15.9)
Immature Grans (Abs): 0 10*3/uL (ref 0.0–0.1)
Immature Granulocytes: 1 %
Lymphocytes Absolute: 1.1 10*3/uL (ref 0.7–3.1)
Lymphs: 16 %
MCH: 31.7 pg (ref 26.6–33.0)
MCHC: 33.1 g/dL (ref 31.5–35.7)
MCV: 96 fL (ref 79–97)
MONOCYTES: 12 %
MONOS ABS: 0.9 10*3/uL (ref 0.1–0.9)
Neutrophils Absolute: 4.9 10*3/uL (ref 1.4–7.0)
Neutrophils: 69 %
Platelets: 206 10*3/uL (ref 150–379)
RBC: 4.42 x10E6/uL (ref 3.77–5.28)
RDW: 13.8 % (ref 12.3–15.4)
WBC: 7.2 10*3/uL (ref 3.4–10.8)

## 2015-07-16 LAB — CARBAMAZEPINE LEVEL, TOTAL: Carbamazepine (Tegretol), S: 3.2 ug/mL — ABNORMAL LOW (ref 4.0–12.0)

## 2015-07-16 NOTE — Progress Notes (Signed)
I agree with the above plan 

## 2015-07-16 NOTE — Telephone Encounter (Signed)
I called the patient and left a message. I will try her again Monday morning.

## 2015-07-20 ENCOUNTER — Telehealth: Payer: Self-pay

## 2015-07-20 NOTE — Telephone Encounter (Signed)
-----   Message from Ward Givens, NP sent at 07/20/2015 11:00 AM EST ----- Blood work is ok. Alkaline phosphatase is elevated but consistent with previous lab work. This could be due to medication. We will continue to monitor. Please call the patient.

## 2015-07-20 NOTE — Telephone Encounter (Signed)
Called patient. Gave lab results. Patient verbalized understanding.  

## 2015-07-23 ENCOUNTER — Other Ambulatory Visit: Payer: Self-pay

## 2015-07-23 MED ORDER — CARBAMAZEPINE 100 MG PO CHEW: CHEWABLE_TABLET | ORAL | Status: DC

## 2015-07-31 ENCOUNTER — Other Ambulatory Visit: Payer: Self-pay | Admitting: Neurology

## 2015-08-02 ENCOUNTER — Other Ambulatory Visit: Payer: Self-pay

## 2015-08-02 MED ORDER — PREGABALIN 150 MG PO CAPS: 150.0000 mg | ORAL_CAPSULE | Freq: Three times a day (TID) | ORAL | Status: DC

## 2015-09-15 DIAGNOSIS — I10 Essential (primary) hypertension: Secondary | ICD-10-CM | POA: Diagnosis not present

## 2015-09-15 DIAGNOSIS — E1165 Type 2 diabetes mellitus with hyperglycemia: Secondary | ICD-10-CM | POA: Diagnosis not present

## 2015-09-15 DIAGNOSIS — E782 Mixed hyperlipidemia: Secondary | ICD-10-CM | POA: Diagnosis not present

## 2015-09-15 DIAGNOSIS — N183 Chronic kidney disease, stage 3 (moderate): Secondary | ICD-10-CM | POA: Diagnosis not present

## 2015-09-22 DIAGNOSIS — E782 Mixed hyperlipidemia: Secondary | ICD-10-CM | POA: Diagnosis not present

## 2015-09-22 DIAGNOSIS — Z0001 Encounter for general adult medical examination with abnormal findings: Secondary | ICD-10-CM | POA: Diagnosis not present

## 2015-09-22 DIAGNOSIS — E1122 Type 2 diabetes mellitus with diabetic chronic kidney disease: Secondary | ICD-10-CM | POA: Diagnosis not present

## 2015-09-22 DIAGNOSIS — G500 Trigeminal neuralgia: Secondary | ICD-10-CM | POA: Diagnosis not present

## 2015-09-22 DIAGNOSIS — Z1389 Encounter for screening for other disorder: Secondary | ICD-10-CM | POA: Diagnosis not present

## 2015-10-07 DIAGNOSIS — S0083XA Contusion of other part of head, initial encounter: Secondary | ICD-10-CM | POA: Diagnosis not present

## 2015-12-02 DIAGNOSIS — H26493 Other secondary cataract, bilateral: Secondary | ICD-10-CM | POA: Diagnosis not present

## 2016-01-17 ENCOUNTER — Encounter: Payer: Self-pay | Admitting: Adult Health

## 2016-01-17 ENCOUNTER — Ambulatory Visit (INDEPENDENT_AMBULATORY_CARE_PROVIDER_SITE_OTHER): Payer: Medicare Other | Admitting: Adult Health

## 2016-01-17 VITALS — BP 144/86 | HR 72 | Ht 59.0 in | Wt 229.4 lb

## 2016-01-17 DIAGNOSIS — G500 Trigeminal neuralgia: Secondary | ICD-10-CM

## 2016-01-17 NOTE — Patient Instructions (Signed)
Continue Carbamazepine and gabapentin Referral to neurosurgery  Blood work- ask PCP for: CBC, CMP and carbamazepine level If your symptoms worsen or you develop new symptoms please let us know.

## 2016-01-17 NOTE — Progress Notes (Addendum)
PATIENT: Kayla Payne DOB: [birth year]  REASON FOR VISIT: follow up HISTORY FROM: patient  HISTORY OF PRESENT ILLNESS:  Today 01/17/16: Ms. Kayla Payne is a 70 year old female with a history of trigeminal neuralgia. She returns today for follow-up. She has continued taking carbamazepine 100 mg 3 times a day as well as gabapentin 600 mg 3 times a day. She states that in the last 6 months she's had a flareup every couple months. She states that she will go through cycles of no flareups.  the pain is always on the left side of the face in the left cheek. She states that for the most part carbamazepine and gabapentin offer her good benefit however she is interested in potential surgical options. She denies any new neurological symptoms. She returns today for follow-up.     HISTORY ([name]):67 year Caucasian lady with left jaw intermittent sharp shooting pain involving left lower jaw mailnly lasting few seconds. At times and for few hours sometimes. Pain is moderate to severe and triggered by cold exposure and used to be relieved by gabapentin which she takes 1200 mg three times daily and tolerates quite well but last 1 year pain is suboptimally relieved and more frequent. She has done relatively well last 2 weeks with no pain. She has not tried carbamezapine, topamax or lyrica. 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 She also takes norco and vicodin for arthritic pain.She had full dental evaluation which was normal.  Update 02/09/14: She returns for followup of her last visit the month ago. She states that Lyrica it did help her for the first to 3 weeks but now the pain seems to be coming back. She gets fairly good relief in the morning when she takes her first dose but lunchtime the pain seems to return. She is not having significant dizziness or other side effects. She has discontinue Topamax and remains on Gabapentin 1200mg  TID. y. She  continues to refuse referral to neurosurgery for, gammaknife or microvascular decompression. Last visit 01/14/2014 : She returns for followup of the last with a month ago. She does not report significant benefit after adding Topamax which she is taking currently at the dose of 50 mg twice daily she is tolerating it well but it has not particularly helped. She remains on t Gabapentin 1200mg  three times daily. She has not tried Lyrica, carbamazepine or baclofen yet. The pain is intermittent but present every time she eats or drinks anything. Pain is severe and intolerable. I discussed with the patient surgical treatment options including trigeminal nerve surgery with gamma knife or microvascular decompression but she is unwilling to discuss surgical options at the present time. Update 03/24/14 : She returns for followup after last visit 6 weeks ago. She again states that she got initial relief after increasing the dose of Lyrica to 150 mg 3 times daily but for the last several weeks the pain has again in intolerable. She'll cannot identify specific triggers. She is able to do most of activities of daily level living but would like better control of her pain. She continues to refuse referral to neurosurgery. She is currently taking gabapentin 1200 mg three times daily and Lyrica 150 mg 3 times daily and tolerating it well without significant side effects.  Update 05/25/14 (LL): She returns for followup after last visit 2 months ago. Since starting Carbamazepine 100 mg 3 times daily her trigeminal pain is relieved. She is currently also  taking gabapentin 1200 mg three times daily and Lyrica 150 mg 3 times daily and tolerating it well without significant side effects. Update 01/12/2015 : She returns for follow-up after last visit the sixth once ago. She states her trigeminal neuralgic pain continues to be well controlled on the current medication regimen of carbamazepine 100 mg 3 times daily as well as gabapentin 303  times daily and Benicar 153 times daily. She still gets the pain around once a day or so but last only a few seconds. It is often triggered by exposure to cold water or food. She has not had any new health problems or any hospital admissions. She is tolerating the current medication regimen without significant side effects. She has no new complaints.   UPDATE 07/15/15: Ms. Kayla Payne is a 70 year old female with a history of trigeminal neuralgia. She returns today for follow-up. The patient continues to take carbamazepine 100 mg 3 times a day. She also takes gabapentin 600 mg 3 times a day. Patient reports that she is not had any facial pain. Denies any problems eating or chewing. Overall she feels that she is doing well. She returns today for an evaluation.  REVIEW OF SYSTEMS: Out of a complete 14 system review of symptoms, the patient complains only of the following symptoms, and all other reviewed systems are negative.  Eye itching, excessive eating, runny nose, restless leg, depression, nervous/anxious  ALLERGIES: Allergies  Allergen Reactions  . Morphine Swelling  . Morphine And Related Other (See Comments) and Rash    HOME MEDICATIONS: Outpatient Prescriptions Prior to Visit  Medication Sig Dispense Refill  . ACCU-CHEK AVIVA PLUS test strip     . ACCU-CHEK SOFTCLIX LANCETS lancets     . amLODipine (NORVASC) 10 MG tablet     . aspirin EC 81 MG tablet Take 81 mg by mouth daily.    . Black Cohosh 40 MG CAPS Take by mouth daily.    . carbamazepine (TEGRETOL) 100 MG chewable tablet ONE TABLET THREE TIMES DAILY 90 tablet 5  . CINNAMON PO Take 2 tablets by mouth daily.    . cycloSPORINE (RESTASIS) 0.05 % ophthalmic emulsion Place 1 drop into both eyes 2 (two) times daily as needed.     Marland Kitchen FLUoxetine (PROZAC) 40 MG capsule Take 40 mg by mouth at bedtime.    . gabapentin (NEURONTIN) 300 MG capsule Take 2 capsules by mouth 3 (three) times daily.    Marland Kitchen HYDROcodone-acetaminophen (NORCO) 10-325 MG per  tablet     . KLOR-CON M20 20 MEQ tablet     . liothyronine (CYTOMEL) 25 MCG tablet Take 25 mcg by mouth every morning.    Marland Kitchen losartan-hydrochlorothiazide (HYZAAR) 100-12.5 MG per tablet Take 1 tablet by mouth daily.    . Magnesium 250 MG TABS Take 1 tablet by mouth daily.    . Omega-3 Fatty Acids (FISH OIL) 1200 MG CAPS Take 1,200 mg by mouth daily.    . pregabalin (LYRICA) 150 MG capsule Take 1 capsule (150 mg total) by mouth 3 (three) times daily. 90 capsule 5  . vitamin B-12 (CYANOCOBALAMIN) 1000 MCG tablet Take 1,000 mcg by mouth daily.    . vitamin C (ASCORBIC ACID) 500 MG tablet Take 1,000 mg by mouth daily.     No facility-administered medications prior to visit.    PAST MEDICAL HISTORY: Past Medical History  Diagnosis Date  . Hypertension   . Depression   . Neuralgia     trimenial  . Osteoarthritis   .  Shortness of breath   . Diabetes mellitus without complication ([place])     PAST SURGICAL HISTORY: Past Surgical History  Procedure Laterality Date  . Cesarean section      x2  . Cholecystectomy  2000    [place]  . Hernia repair  2000, 10/28/13    right and left inguinal-[facility]  . Fracture surgery      skull fracture repair-behind left ear  . Exploration middle ear      hearing loss- with surgery. subsequently found skull fracture as reason for hearing loss.  . Cataract extraction w/phaco  05/16/2012    Procedure: CATARACT EXTRACTION PHACO AND INTRAOCULAR LENS PLACEMENT (IOC);  Surgeon: Tonny Branch, MD;  Location: [facility];  Service: Ophthalmology;  Laterality: Left;  CDE 9.31  . Cataract extraction w/phaco Right 08/19/2012    Procedure: CATARACT EXTRACTION PHACO AND INTRAOCULAR LENS PLACEMENT (IOC);  Surgeon: Tonny Branch, MD;  Location: [facility];  Service: Ophthalmology;  Laterality: Right;  CDE: 9.46    FAMILY HISTORY: Family History  Problem Relation Age of Onset  . Hyperlipidemia Mother   . Hypertension Mother   . Diabetes Mother   . Hyperlipidemia Father   . Hypertension  Father     SOCIAL HISTORY: Social History   Social History  . Marital Status: Legally Separated    Spouse Name: N/A  . Number of Children: 2  . Years of Education: 12   Occupational History  . unemployed    Social History Main Topics  . Smoking status: Never Smoker   . Smokeless tobacco: Never Used  . Alcohol Use: No  . Drug Use: No  . Sexual Activity: No   Other Topics Concern  . Not on file   Social History Narrative   Patient lives at home with her boyfriend.   Caffeine Use: couple of servings of tea daily   Patient have 2 children.    Patient has a high school education.    Patient is not currently working.   Patient is right handed.            PHYSICAL EXAM  Filed Vitals:   01/17/16 1411  BP: 144/86  Pulse: 72  Height: 4\' 11"  (1.499 m)  Weight: 229 lb 6.4 oz (104.055 kg)   Body mass index is 46.31 kg/(m^2).  Generalized: Well developed, in no acute distress , Obese  Neurological examination  Mentation: Alert oriented to time, place, history taking. Follows all commands speech and language fluent Cranial nerve II-XII: Pupils were equal round reactive to light. Extraocular movements were full, visual field were full on confrontational test. Facial sensation and strength were normal. Uvula tongue midline. Head turning and shoulder shrug  were normal and symmetric. Motor: The motor testing reveals 5 over 5 strength of all 4 extremities. Good symmetric motor tone is noted throughout.  Sensory: Sensory testing is intact to soft touch on all 4 extremities. No evidence of extinction is noted.  Coordination: Cerebellar testing reveals good finger-nose-finger and heel-to-shin bilaterally.  Gait and station: Gait is slightly unsteady. Uses a cane when ambulating. Tandem gait not attended.  Reflexes: Deep tendon reflexes are symmetric and normal bilaterally.   DIAGNOSTIC DATA (LABS, IMAGING, TESTING) - I reviewed patient records, labs, notes, testing and imaging  myself where available.  Lab Results  Component Value Date   WBC 7.2 07/15/2015   HGB 13.4 06/01/2013   HCT 42.3 07/15/2015   MCV 96 07/15/2015   PLT 206 07/15/2015      Component  Value Date/Time   NA 142 07/15/2015 1029   NA 142 06/02/2013 0612   K 4.4 07/15/2015 1029   CL 101 07/15/2015 1029   CO2 25 07/15/2015 1029   GLUCOSE 114* 07/15/2015 1029   GLUCOSE 127* 06/02/2013 0612   BUN 20 07/15/2015 1029   BUN 12 06/02/2013 0612   CREATININE 1.08* 07/15/2015 1029   CALCIUM 9.0 07/15/2015 1029   PROT 6.9 07/15/2015 1029   PROT 8.1 05/31/2013 1341   ALBUMIN 3.8 07/15/2015 1029   ALBUMIN 3.5 05/31/2013 1341   AST 12 07/15/2015 1029   ALT 12 07/15/2015 1029   ALKPHOS 128* 07/15/2015 1029   BILITOT 0.3 07/15/2015 1029   BILITOT 0.6 05/31/2013 1341   GFRNONAA 3* 07/15/2015 1029   GFRAA 61 07/15/2015 1029      ASSESSMENT AND PLAN 70 y.o. year old female  has a past medical history of Hypertension; Depression; Neuralgia; Osteoarthritis; Shortness of breath; and Diabetes mellitus without complication ([place]). here with:  1. Trigeminal neuralgia  The patient will continue on carbamazepine and gabapentin. She would like to discuss potential surgical options. I will refer her to neurosurgery for an evaluation. The patient is having blood work tomorrow with her primary care I have asked that he carbamazepine level be checked at that time. Patient is amenable to this plan. She will follow-up in 6 months or sooner if needed.     Ward Givens, MSN, NP-C 01/17/2016, 3:08 PM [facility] [address] [place], [place] [address] [phone]   I reviewed the above note and documentation by the Nurse Practitioner and agree with the history, physical exam, assessment and plan as outlined above. I was immediately available for face-to-face consultation. Star Age, MD, PhD [facility])

## 2016-01-18 DIAGNOSIS — E1122 Type 2 diabetes mellitus with diabetic chronic kidney disease: Secondary | ICD-10-CM | POA: Diagnosis not present

## 2016-01-18 DIAGNOSIS — N183 Chronic kidney disease, stage 3 (moderate): Secondary | ICD-10-CM | POA: Diagnosis not present

## 2016-01-18 DIAGNOSIS — I10 Essential (primary) hypertension: Secondary | ICD-10-CM | POA: Diagnosis not present

## 2016-01-18 DIAGNOSIS — E782 Mixed hyperlipidemia: Secondary | ICD-10-CM | POA: Diagnosis not present

## 2016-01-20 DIAGNOSIS — I10 Essential (primary) hypertension: Secondary | ICD-10-CM | POA: Diagnosis not present

## 2016-01-20 DIAGNOSIS — E1122 Type 2 diabetes mellitus with diabetic chronic kidney disease: Secondary | ICD-10-CM | POA: Diagnosis not present

## 2016-01-20 DIAGNOSIS — G500 Trigeminal neuralgia: Secondary | ICD-10-CM | POA: Diagnosis not present

## 2016-01-20 DIAGNOSIS — E782 Mixed hyperlipidemia: Secondary | ICD-10-CM | POA: Diagnosis not present

## 2016-02-23 ENCOUNTER — Other Ambulatory Visit: Payer: Self-pay | Admitting: Adult Health

## 2016-02-23 MED ORDER — PREGABALIN 150 MG PO CAPS: 150.0000 mg | ORAL_CAPSULE | Freq: Three times a day (TID) | ORAL | 5 refills | Status: DC

## 2016-02-23 NOTE — Telephone Encounter (Signed)
Rx signed and faxed to pharmacy

## 2016-02-23 NOTE — Telephone Encounter (Signed)
Rx reprinted d/t no supervising MD listed on hard copy.

## 2016-02-23 NOTE — Addendum Note (Signed)
Addended by: Monte Fantasia on: 02/23/2016 02:19 PM   Modules accepted: Orders

## 2016-03-01 DIAGNOSIS — I10 Essential (primary) hypertension: Secondary | ICD-10-CM | POA: Diagnosis not present

## 2016-03-01 DIAGNOSIS — G500 Trigeminal neuralgia: Secondary | ICD-10-CM | POA: Diagnosis not present

## 2016-03-09 ENCOUNTER — Other Ambulatory Visit: Payer: Self-pay | Admitting: Neurosurgery

## 2016-03-09 DIAGNOSIS — G500 Trigeminal neuralgia: Secondary | ICD-10-CM

## 2016-03-18 ENCOUNTER — Ambulatory Visit
Admission: RE | Admit: 2016-03-18 | Discharge: 2016-03-18 | Disposition: A | Discharge status: Home / Self Care | Payer: Medicare Other | Source: Ambulatory Visit | Attending: Neurosurgery | Admitting: Neurosurgery

## 2016-03-18 DIAGNOSIS — G500 Trigeminal neuralgia: Secondary | ICD-10-CM | POA: Diagnosis not present

## 2016-03-18 MED ORDER — GADOBENATE DIMEGLUMINE 529 MG/ML IV SOLN
10.0000 mL | Freq: Once | INTRAVENOUS | Status: AC | PRN
  Administered 2016-03-18: 10 mL via INTRAVENOUS

## 2016-03-22 ENCOUNTER — Other Ambulatory Visit: Payer: Medicaid Other

## 2016-04-17 DIAGNOSIS — I10 Essential (primary) hypertension: Secondary | ICD-10-CM | POA: Diagnosis not present

## 2016-04-17 DIAGNOSIS — E782 Mixed hyperlipidemia: Secondary | ICD-10-CM | POA: Diagnosis not present

## 2016-04-17 DIAGNOSIS — E1122 Type 2 diabetes mellitus with diabetic chronic kidney disease: Secondary | ICD-10-CM | POA: Diagnosis not present

## 2016-04-20 DIAGNOSIS — G500 Trigeminal neuralgia: Secondary | ICD-10-CM | POA: Diagnosis not present

## 2016-04-20 DIAGNOSIS — E782 Mixed hyperlipidemia: Secondary | ICD-10-CM | POA: Diagnosis not present

## 2016-04-20 DIAGNOSIS — Z23 Encounter for immunization: Secondary | ICD-10-CM | POA: Diagnosis not present

## 2016-04-20 DIAGNOSIS — I10 Essential (primary) hypertension: Secondary | ICD-10-CM | POA: Diagnosis not present

## 2016-04-20 DIAGNOSIS — E1122 Type 2 diabetes mellitus with diabetic chronic kidney disease: Secondary | ICD-10-CM | POA: Diagnosis not present

## 2016-04-20 DIAGNOSIS — Z79891 Long term (current) use of opiate analgesic: Secondary | ICD-10-CM | POA: Diagnosis not present

## 2016-07-16 DIAGNOSIS — M170 Bilateral primary osteoarthritis of knee: Secondary | ICD-10-CM | POA: Diagnosis not present

## 2016-07-16 DIAGNOSIS — J440 Chronic obstructive pulmonary disease with acute lower respiratory infection: Secondary | ICD-10-CM | POA: Diagnosis not present

## 2016-07-16 DIAGNOSIS — M6281 Muscle weakness (generalized): Secondary | ICD-10-CM | POA: Diagnosis not present

## 2016-07-16 DIAGNOSIS — R0602 Shortness of breath: Secondary | ICD-10-CM | POA: Diagnosis not present

## 2016-07-16 DIAGNOSIS — I129 Hypertensive chronic kidney disease with stage 1 through stage 4 chronic kidney disease, or unspecified chronic kidney disease: Secondary | ICD-10-CM | POA: Diagnosis not present

## 2016-07-16 DIAGNOSIS — J441 Chronic obstructive pulmonary disease with (acute) exacerbation: Secondary | ICD-10-CM | POA: Diagnosis not present

## 2016-07-16 DIAGNOSIS — R0902 Hypoxemia: Secondary | ICD-10-CM | POA: Diagnosis not present

## 2016-07-16 DIAGNOSIS — E1122 Type 2 diabetes mellitus with diabetic chronic kidney disease: Secondary | ICD-10-CM | POA: Diagnosis not present

## 2016-07-16 DIAGNOSIS — J9601 Acute respiratory failure with hypoxia: Secondary | ICD-10-CM | POA: Diagnosis not present

## 2016-07-16 DIAGNOSIS — M542 Cervicalgia: Secondary | ICD-10-CM | POA: Diagnosis not present

## 2016-07-16 DIAGNOSIS — R05 Cough: Secondary | ICD-10-CM | POA: Diagnosis not present

## 2016-07-16 DIAGNOSIS — G500 Trigeminal neuralgia: Secondary | ICD-10-CM | POA: Diagnosis not present

## 2016-07-16 DIAGNOSIS — M160 Bilateral primary osteoarthritis of hip: Secondary | ICD-10-CM | POA: Diagnosis not present

## 2016-07-16 DIAGNOSIS — S0990XA Unspecified injury of head, initial encounter: Secondary | ICD-10-CM | POA: Diagnosis not present

## 2016-07-16 DIAGNOSIS — Z79899 Other long term (current) drug therapy: Secondary | ICD-10-CM | POA: Diagnosis not present

## 2016-07-16 DIAGNOSIS — N183 Chronic kidney disease, stage 3 (moderate): Secondary | ICD-10-CM | POA: Diagnosis not present

## 2016-07-16 DIAGNOSIS — R531 Weakness: Secondary | ICD-10-CM | POA: Diagnosis not present

## 2016-07-16 DIAGNOSIS — W1839XA Other fall on same level, initial encounter: Secondary | ICD-10-CM | POA: Diagnosis not present

## 2016-07-16 DIAGNOSIS — S199XXA Unspecified injury of neck, initial encounter: Secondary | ICD-10-CM | POA: Diagnosis not present

## 2016-07-16 DIAGNOSIS — I700 Atherosclerosis of aorta: Secondary | ICD-10-CM | POA: Diagnosis not present

## 2016-07-16 DIAGNOSIS — J22 Unspecified acute lower respiratory infection: Secondary | ICD-10-CM | POA: Diagnosis not present

## 2016-07-16 DIAGNOSIS — R262 Difficulty in walking, not elsewhere classified: Secondary | ICD-10-CM | POA: Diagnosis not present

## 2016-07-16 DIAGNOSIS — I6789 Other cerebrovascular disease: Secondary | ICD-10-CM | POA: Diagnosis not present

## 2016-07-16 DIAGNOSIS — R404 Transient alteration of awareness: Secondary | ICD-10-CM | POA: Diagnosis not present

## 2016-07-16 DIAGNOSIS — R51 Headache: Secondary | ICD-10-CM | POA: Diagnosis not present

## 2016-07-16 DIAGNOSIS — Z7984 Long term (current) use of oral hypoglycemic drugs: Secondary | ICD-10-CM | POA: Diagnosis not present

## 2016-07-16 DIAGNOSIS — J9602 Acute respiratory failure with hypercapnia: Secondary | ICD-10-CM | POA: Diagnosis not present

## 2016-07-20 ENCOUNTER — Ambulatory Visit: Payer: Medicare Other | Admitting: Neurology

## 2016-07-21 DIAGNOSIS — G500 Trigeminal neuralgia: Secondary | ICD-10-CM | POA: Diagnosis not present

## 2016-07-21 DIAGNOSIS — M160 Bilateral primary osteoarthritis of hip: Secondary | ICD-10-CM | POA: Diagnosis not present

## 2016-07-21 DIAGNOSIS — M454 Ankylosing spondylitis of thoracic region: Secondary | ICD-10-CM | POA: Diagnosis not present

## 2016-07-21 DIAGNOSIS — J441 Chronic obstructive pulmonary disease with (acute) exacerbation: Secondary | ICD-10-CM | POA: Diagnosis not present

## 2016-07-21 DIAGNOSIS — Z7984 Long term (current) use of oral hypoglycemic drugs: Secondary | ICD-10-CM | POA: Diagnosis not present

## 2016-07-21 DIAGNOSIS — M170 Bilateral primary osteoarthritis of knee: Secondary | ICD-10-CM | POA: Diagnosis not present

## 2016-07-21 DIAGNOSIS — I129 Hypertensive chronic kidney disease with stage 1 through stage 4 chronic kidney disease, or unspecified chronic kidney disease: Secondary | ICD-10-CM | POA: Diagnosis not present

## 2016-07-21 DIAGNOSIS — N183 Chronic kidney disease, stage 3 (moderate): Secondary | ICD-10-CM | POA: Diagnosis not present

## 2016-07-21 DIAGNOSIS — E1122 Type 2 diabetes mellitus with diabetic chronic kidney disease: Secondary | ICD-10-CM | POA: Diagnosis not present

## 2016-07-21 DIAGNOSIS — Z7982 Long term (current) use of aspirin: Secondary | ICD-10-CM | POA: Diagnosis not present

## 2016-07-23 DIAGNOSIS — I129 Hypertensive chronic kidney disease with stage 1 through stage 4 chronic kidney disease, or unspecified chronic kidney disease: Secondary | ICD-10-CM | POA: Diagnosis not present

## 2016-07-23 DIAGNOSIS — G500 Trigeminal neuralgia: Secondary | ICD-10-CM | POA: Diagnosis not present

## 2016-07-23 DIAGNOSIS — N183 Chronic kidney disease, stage 3 (moderate): Secondary | ICD-10-CM | POA: Diagnosis not present

## 2016-07-23 DIAGNOSIS — M170 Bilateral primary osteoarthritis of knee: Secondary | ICD-10-CM | POA: Diagnosis not present

## 2016-07-23 DIAGNOSIS — M454 Ankylosing spondylitis of thoracic region: Secondary | ICD-10-CM | POA: Diagnosis not present

## 2016-07-23 DIAGNOSIS — Z7984 Long term (current) use of oral hypoglycemic drugs: Secondary | ICD-10-CM | POA: Diagnosis not present

## 2016-07-23 DIAGNOSIS — M160 Bilateral primary osteoarthritis of hip: Secondary | ICD-10-CM | POA: Diagnosis not present

## 2016-07-23 DIAGNOSIS — Z7982 Long term (current) use of aspirin: Secondary | ICD-10-CM | POA: Diagnosis not present

## 2016-07-23 DIAGNOSIS — E1122 Type 2 diabetes mellitus with diabetic chronic kidney disease: Secondary | ICD-10-CM | POA: Diagnosis not present

## 2016-07-23 DIAGNOSIS — J441 Chronic obstructive pulmonary disease with (acute) exacerbation: Secondary | ICD-10-CM | POA: Diagnosis not present

## 2016-07-24 DIAGNOSIS — I129 Hypertensive chronic kidney disease with stage 1 through stage 4 chronic kidney disease, or unspecified chronic kidney disease: Secondary | ICD-10-CM | POA: Diagnosis not present

## 2016-07-24 DIAGNOSIS — N183 Chronic kidney disease, stage 3 (moderate): Secondary | ICD-10-CM | POA: Diagnosis not present

## 2016-07-24 DIAGNOSIS — J441 Chronic obstructive pulmonary disease with (acute) exacerbation: Secondary | ICD-10-CM | POA: Diagnosis not present

## 2016-07-24 DIAGNOSIS — Z7982 Long term (current) use of aspirin: Secondary | ICD-10-CM | POA: Diagnosis not present

## 2016-07-24 DIAGNOSIS — E1122 Type 2 diabetes mellitus with diabetic chronic kidney disease: Secondary | ICD-10-CM | POA: Diagnosis not present

## 2016-07-24 DIAGNOSIS — G500 Trigeminal neuralgia: Secondary | ICD-10-CM | POA: Diagnosis not present

## 2016-07-24 DIAGNOSIS — M170 Bilateral primary osteoarthritis of knee: Secondary | ICD-10-CM | POA: Diagnosis not present

## 2016-07-24 DIAGNOSIS — M160 Bilateral primary osteoarthritis of hip: Secondary | ICD-10-CM | POA: Diagnosis not present

## 2016-07-24 DIAGNOSIS — Z7984 Long term (current) use of oral hypoglycemic drugs: Secondary | ICD-10-CM | POA: Diagnosis not present

## 2016-07-24 DIAGNOSIS — M454 Ankylosing spondylitis of thoracic region: Secondary | ICD-10-CM | POA: Diagnosis not present

## 2016-07-25 DIAGNOSIS — M454 Ankylosing spondylitis of thoracic region: Secondary | ICD-10-CM | POA: Diagnosis not present

## 2016-07-25 DIAGNOSIS — E1122 Type 2 diabetes mellitus with diabetic chronic kidney disease: Secondary | ICD-10-CM | POA: Diagnosis not present

## 2016-07-25 DIAGNOSIS — Z7982 Long term (current) use of aspirin: Secondary | ICD-10-CM | POA: Diagnosis not present

## 2016-07-25 DIAGNOSIS — Z7984 Long term (current) use of oral hypoglycemic drugs: Secondary | ICD-10-CM | POA: Diagnosis not present

## 2016-07-25 DIAGNOSIS — M170 Bilateral primary osteoarthritis of knee: Secondary | ICD-10-CM | POA: Diagnosis not present

## 2016-07-25 DIAGNOSIS — I129 Hypertensive chronic kidney disease with stage 1 through stage 4 chronic kidney disease, or unspecified chronic kidney disease: Secondary | ICD-10-CM | POA: Diagnosis not present

## 2016-07-25 DIAGNOSIS — M160 Bilateral primary osteoarthritis of hip: Secondary | ICD-10-CM | POA: Diagnosis not present

## 2016-07-25 DIAGNOSIS — N183 Chronic kidney disease, stage 3 (moderate): Secondary | ICD-10-CM | POA: Diagnosis not present

## 2016-07-25 DIAGNOSIS — G500 Trigeminal neuralgia: Secondary | ICD-10-CM | POA: Diagnosis not present

## 2016-07-25 DIAGNOSIS — J441 Chronic obstructive pulmonary disease with (acute) exacerbation: Secondary | ICD-10-CM | POA: Diagnosis not present

## 2016-07-26 ENCOUNTER — Ambulatory Visit: Payer: Medicare Other | Admitting: Neurology

## 2016-08-02 DIAGNOSIS — J441 Chronic obstructive pulmonary disease with (acute) exacerbation: Secondary | ICD-10-CM | POA: Diagnosis not present

## 2016-08-02 DIAGNOSIS — I129 Hypertensive chronic kidney disease with stage 1 through stage 4 chronic kidney disease, or unspecified chronic kidney disease: Secondary | ICD-10-CM | POA: Diagnosis not present

## 2016-08-02 DIAGNOSIS — E1122 Type 2 diabetes mellitus with diabetic chronic kidney disease: Secondary | ICD-10-CM | POA: Diagnosis not present

## 2016-08-02 DIAGNOSIS — Z7984 Long term (current) use of oral hypoglycemic drugs: Secondary | ICD-10-CM | POA: Diagnosis not present

## 2016-08-02 DIAGNOSIS — Z7982 Long term (current) use of aspirin: Secondary | ICD-10-CM | POA: Diagnosis not present

## 2016-08-02 DIAGNOSIS — G500 Trigeminal neuralgia: Secondary | ICD-10-CM | POA: Diagnosis not present

## 2016-08-02 DIAGNOSIS — M170 Bilateral primary osteoarthritis of knee: Secondary | ICD-10-CM | POA: Diagnosis not present

## 2016-08-02 DIAGNOSIS — N183 Chronic kidney disease, stage 3 (moderate): Secondary | ICD-10-CM | POA: Diagnosis not present

## 2016-08-02 DIAGNOSIS — M160 Bilateral primary osteoarthritis of hip: Secondary | ICD-10-CM | POA: Diagnosis not present

## 2016-08-02 DIAGNOSIS — M454 Ankylosing spondylitis of thoracic region: Secondary | ICD-10-CM | POA: Diagnosis not present

## 2016-08-04 DIAGNOSIS — M160 Bilateral primary osteoarthritis of hip: Secondary | ICD-10-CM | POA: Diagnosis not present

## 2016-08-04 DIAGNOSIS — J441 Chronic obstructive pulmonary disease with (acute) exacerbation: Secondary | ICD-10-CM | POA: Diagnosis not present

## 2016-08-04 DIAGNOSIS — I129 Hypertensive chronic kidney disease with stage 1 through stage 4 chronic kidney disease, or unspecified chronic kidney disease: Secondary | ICD-10-CM | POA: Diagnosis not present

## 2016-08-04 DIAGNOSIS — G500 Trigeminal neuralgia: Secondary | ICD-10-CM | POA: Diagnosis not present

## 2016-08-04 DIAGNOSIS — E1122 Type 2 diabetes mellitus with diabetic chronic kidney disease: Secondary | ICD-10-CM | POA: Diagnosis not present

## 2016-08-04 DIAGNOSIS — M454 Ankylosing spondylitis of thoracic region: Secondary | ICD-10-CM | POA: Diagnosis not present

## 2016-08-04 DIAGNOSIS — M170 Bilateral primary osteoarthritis of knee: Secondary | ICD-10-CM | POA: Diagnosis not present

## 2016-08-04 DIAGNOSIS — N183 Chronic kidney disease, stage 3 (moderate): Secondary | ICD-10-CM | POA: Diagnosis not present

## 2016-08-04 DIAGNOSIS — Z7984 Long term (current) use of oral hypoglycemic drugs: Secondary | ICD-10-CM | POA: Diagnosis not present

## 2016-08-04 DIAGNOSIS — Z7982 Long term (current) use of aspirin: Secondary | ICD-10-CM | POA: Diagnosis not present

## 2016-08-07 DIAGNOSIS — M160 Bilateral primary osteoarthritis of hip: Secondary | ICD-10-CM | POA: Diagnosis not present

## 2016-08-07 DIAGNOSIS — J441 Chronic obstructive pulmonary disease with (acute) exacerbation: Secondary | ICD-10-CM | POA: Diagnosis not present

## 2016-08-07 DIAGNOSIS — M170 Bilateral primary osteoarthritis of knee: Secondary | ICD-10-CM | POA: Diagnosis not present

## 2016-08-07 DIAGNOSIS — Z7984 Long term (current) use of oral hypoglycemic drugs: Secondary | ICD-10-CM | POA: Diagnosis not present

## 2016-08-07 DIAGNOSIS — M454 Ankylosing spondylitis of thoracic region: Secondary | ICD-10-CM | POA: Diagnosis not present

## 2016-08-07 DIAGNOSIS — Z7982 Long term (current) use of aspirin: Secondary | ICD-10-CM | POA: Diagnosis not present

## 2016-08-07 DIAGNOSIS — N183 Chronic kidney disease, stage 3 (moderate): Secondary | ICD-10-CM | POA: Diagnosis not present

## 2016-08-07 DIAGNOSIS — G500 Trigeminal neuralgia: Secondary | ICD-10-CM | POA: Diagnosis not present

## 2016-08-07 DIAGNOSIS — E1122 Type 2 diabetes mellitus with diabetic chronic kidney disease: Secondary | ICD-10-CM | POA: Diagnosis not present

## 2016-08-07 DIAGNOSIS — I129 Hypertensive chronic kidney disease with stage 1 through stage 4 chronic kidney disease, or unspecified chronic kidney disease: Secondary | ICD-10-CM | POA: Diagnosis not present

## 2016-08-08 DIAGNOSIS — G500 Trigeminal neuralgia: Secondary | ICD-10-CM | POA: Diagnosis not present

## 2016-08-08 DIAGNOSIS — I129 Hypertensive chronic kidney disease with stage 1 through stage 4 chronic kidney disease, or unspecified chronic kidney disease: Secondary | ICD-10-CM | POA: Diagnosis not present

## 2016-08-08 DIAGNOSIS — M454 Ankylosing spondylitis of thoracic region: Secondary | ICD-10-CM | POA: Diagnosis not present

## 2016-08-08 DIAGNOSIS — Z7984 Long term (current) use of oral hypoglycemic drugs: Secondary | ICD-10-CM | POA: Diagnosis not present

## 2016-08-08 DIAGNOSIS — M160 Bilateral primary osteoarthritis of hip: Secondary | ICD-10-CM | POA: Diagnosis not present

## 2016-08-08 DIAGNOSIS — E1122 Type 2 diabetes mellitus with diabetic chronic kidney disease: Secondary | ICD-10-CM | POA: Diagnosis not present

## 2016-08-08 DIAGNOSIS — M170 Bilateral primary osteoarthritis of knee: Secondary | ICD-10-CM | POA: Diagnosis not present

## 2016-08-08 DIAGNOSIS — Z7982 Long term (current) use of aspirin: Secondary | ICD-10-CM | POA: Diagnosis not present

## 2016-08-08 DIAGNOSIS — J441 Chronic obstructive pulmonary disease with (acute) exacerbation: Secondary | ICD-10-CM | POA: Diagnosis not present

## 2016-08-08 DIAGNOSIS — N183 Chronic kidney disease, stage 3 (moderate): Secondary | ICD-10-CM | POA: Diagnosis not present

## 2016-08-09 DIAGNOSIS — I129 Hypertensive chronic kidney disease with stage 1 through stage 4 chronic kidney disease, or unspecified chronic kidney disease: Secondary | ICD-10-CM | POA: Diagnosis not present

## 2016-08-09 DIAGNOSIS — J441 Chronic obstructive pulmonary disease with (acute) exacerbation: Secondary | ICD-10-CM | POA: Diagnosis not present

## 2016-08-09 DIAGNOSIS — G500 Trigeminal neuralgia: Secondary | ICD-10-CM | POA: Diagnosis not present

## 2016-08-09 DIAGNOSIS — Z7984 Long term (current) use of oral hypoglycemic drugs: Secondary | ICD-10-CM | POA: Diagnosis not present

## 2016-08-09 DIAGNOSIS — Z7982 Long term (current) use of aspirin: Secondary | ICD-10-CM | POA: Diagnosis not present

## 2016-08-09 DIAGNOSIS — M170 Bilateral primary osteoarthritis of knee: Secondary | ICD-10-CM | POA: Diagnosis not present

## 2016-08-09 DIAGNOSIS — M454 Ankylosing spondylitis of thoracic region: Secondary | ICD-10-CM | POA: Diagnosis not present

## 2016-08-09 DIAGNOSIS — N183 Chronic kidney disease, stage 3 (moderate): Secondary | ICD-10-CM | POA: Diagnosis not present

## 2016-08-09 DIAGNOSIS — E1122 Type 2 diabetes mellitus with diabetic chronic kidney disease: Secondary | ICD-10-CM | POA: Diagnosis not present

## 2016-08-09 DIAGNOSIS — M160 Bilateral primary osteoarthritis of hip: Secondary | ICD-10-CM | POA: Diagnosis not present

## 2016-08-10 DIAGNOSIS — I440 Atrioventricular block, first degree: Secondary | ICD-10-CM | POA: Diagnosis not present

## 2016-08-10 DIAGNOSIS — I447 Left bundle-branch block, unspecified: Secondary | ICD-10-CM | POA: Diagnosis not present

## 2016-08-10 DIAGNOSIS — Z79899 Other long term (current) drug therapy: Secondary | ICD-10-CM | POA: Diagnosis not present

## 2016-08-10 DIAGNOSIS — G4733 Obstructive sleep apnea (adult) (pediatric): Secondary | ICD-10-CM | POA: Diagnosis not present

## 2016-08-10 DIAGNOSIS — I517 Cardiomegaly: Secondary | ICD-10-CM | POA: Diagnosis not present

## 2016-08-10 DIAGNOSIS — Z7982 Long term (current) use of aspirin: Secondary | ICD-10-CM | POA: Diagnosis not present

## 2016-08-10 DIAGNOSIS — E119 Type 2 diabetes mellitus without complications: Secondary | ICD-10-CM | POA: Diagnosis not present

## 2016-08-10 DIAGNOSIS — I10 Essential (primary) hypertension: Secondary | ICD-10-CM | POA: Diagnosis not present

## 2016-08-11 DIAGNOSIS — I129 Hypertensive chronic kidney disease with stage 1 through stage 4 chronic kidney disease, or unspecified chronic kidney disease: Secondary | ICD-10-CM | POA: Diagnosis not present

## 2016-08-11 DIAGNOSIS — M454 Ankylosing spondylitis of thoracic region: Secondary | ICD-10-CM | POA: Diagnosis not present

## 2016-08-11 DIAGNOSIS — G500 Trigeminal neuralgia: Secondary | ICD-10-CM | POA: Diagnosis not present

## 2016-08-11 DIAGNOSIS — J441 Chronic obstructive pulmonary disease with (acute) exacerbation: Secondary | ICD-10-CM | POA: Diagnosis not present

## 2016-08-11 DIAGNOSIS — Z7982 Long term (current) use of aspirin: Secondary | ICD-10-CM | POA: Diagnosis not present

## 2016-08-11 DIAGNOSIS — M160 Bilateral primary osteoarthritis of hip: Secondary | ICD-10-CM | POA: Diagnosis not present

## 2016-08-11 DIAGNOSIS — N183 Chronic kidney disease, stage 3 (moderate): Secondary | ICD-10-CM | POA: Diagnosis not present

## 2016-08-11 DIAGNOSIS — Z7984 Long term (current) use of oral hypoglycemic drugs: Secondary | ICD-10-CM | POA: Diagnosis not present

## 2016-08-11 DIAGNOSIS — M170 Bilateral primary osteoarthritis of knee: Secondary | ICD-10-CM | POA: Diagnosis not present

## 2016-08-11 DIAGNOSIS — E1122 Type 2 diabetes mellitus with diabetic chronic kidney disease: Secondary | ICD-10-CM | POA: Diagnosis not present

## 2016-08-17 DIAGNOSIS — N183 Chronic kidney disease, stage 3 (moderate): Secondary | ICD-10-CM | POA: Diagnosis not present

## 2016-08-17 DIAGNOSIS — E1122 Type 2 diabetes mellitus with diabetic chronic kidney disease: Secondary | ICD-10-CM | POA: Diagnosis not present

## 2016-08-17 DIAGNOSIS — G500 Trigeminal neuralgia: Secondary | ICD-10-CM | POA: Diagnosis not present

## 2016-08-17 DIAGNOSIS — Z7982 Long term (current) use of aspirin: Secondary | ICD-10-CM | POA: Diagnosis not present

## 2016-08-17 DIAGNOSIS — Z7984 Long term (current) use of oral hypoglycemic drugs: Secondary | ICD-10-CM | POA: Diagnosis not present

## 2016-08-17 DIAGNOSIS — M160 Bilateral primary osteoarthritis of hip: Secondary | ICD-10-CM | POA: Diagnosis not present

## 2016-08-17 DIAGNOSIS — J441 Chronic obstructive pulmonary disease with (acute) exacerbation: Secondary | ICD-10-CM | POA: Diagnosis not present

## 2016-08-17 DIAGNOSIS — M170 Bilateral primary osteoarthritis of knee: Secondary | ICD-10-CM | POA: Diagnosis not present

## 2016-08-17 DIAGNOSIS — M454 Ankylosing spondylitis of thoracic region: Secondary | ICD-10-CM | POA: Diagnosis not present

## 2016-08-17 DIAGNOSIS — I129 Hypertensive chronic kidney disease with stage 1 through stage 4 chronic kidney disease, or unspecified chronic kidney disease: Secondary | ICD-10-CM | POA: Diagnosis not present

## 2016-08-18 DIAGNOSIS — J9611 Chronic respiratory failure with hypoxia: Secondary | ICD-10-CM | POA: Diagnosis not present

## 2016-08-19 DIAGNOSIS — G500 Trigeminal neuralgia: Secondary | ICD-10-CM | POA: Diagnosis not present

## 2016-08-24 DIAGNOSIS — J441 Chronic obstructive pulmonary disease with (acute) exacerbation: Secondary | ICD-10-CM | POA: Diagnosis not present

## 2016-08-24 DIAGNOSIS — Z7984 Long term (current) use of oral hypoglycemic drugs: Secondary | ICD-10-CM | POA: Diagnosis not present

## 2016-08-24 DIAGNOSIS — E1122 Type 2 diabetes mellitus with diabetic chronic kidney disease: Secondary | ICD-10-CM | POA: Diagnosis not present

## 2016-08-24 DIAGNOSIS — M170 Bilateral primary osteoarthritis of knee: Secondary | ICD-10-CM | POA: Diagnosis not present

## 2016-08-24 DIAGNOSIS — I129 Hypertensive chronic kidney disease with stage 1 through stage 4 chronic kidney disease, or unspecified chronic kidney disease: Secondary | ICD-10-CM | POA: Diagnosis not present

## 2016-08-24 DIAGNOSIS — N183 Chronic kidney disease, stage 3 (moderate): Secondary | ICD-10-CM | POA: Diagnosis not present

## 2016-08-24 DIAGNOSIS — G500 Trigeminal neuralgia: Secondary | ICD-10-CM | POA: Diagnosis not present

## 2016-08-24 DIAGNOSIS — Z7982 Long term (current) use of aspirin: Secondary | ICD-10-CM | POA: Diagnosis not present

## 2016-08-24 DIAGNOSIS — M454 Ankylosing spondylitis of thoracic region: Secondary | ICD-10-CM | POA: Diagnosis not present

## 2016-08-24 DIAGNOSIS — M160 Bilateral primary osteoarthritis of hip: Secondary | ICD-10-CM | POA: Diagnosis not present

## 2016-09-16 DIAGNOSIS — G500 Trigeminal neuralgia: Secondary | ICD-10-CM | POA: Diagnosis not present

## 2016-09-18 DIAGNOSIS — E782 Mixed hyperlipidemia: Secondary | ICD-10-CM | POA: Diagnosis not present

## 2016-09-18 DIAGNOSIS — I10 Essential (primary) hypertension: Secondary | ICD-10-CM | POA: Diagnosis not present

## 2016-09-18 DIAGNOSIS — E1122 Type 2 diabetes mellitus with diabetic chronic kidney disease: Secondary | ICD-10-CM | POA: Diagnosis not present

## 2016-09-18 DIAGNOSIS — N183 Chronic kidney disease, stage 3 (moderate): Secondary | ICD-10-CM | POA: Diagnosis not present

## 2016-09-22 DIAGNOSIS — Z1212 Encounter for screening for malignant neoplasm of rectum: Secondary | ICD-10-CM | POA: Diagnosis not present

## 2016-09-22 DIAGNOSIS — G500 Trigeminal neuralgia: Secondary | ICD-10-CM | POA: Diagnosis not present

## 2016-09-22 DIAGNOSIS — E782 Mixed hyperlipidemia: Secondary | ICD-10-CM | POA: Diagnosis not present

## 2016-09-22 DIAGNOSIS — E1122 Type 2 diabetes mellitus with diabetic chronic kidney disease: Secondary | ICD-10-CM | POA: Diagnosis not present

## 2016-09-22 DIAGNOSIS — Z0001 Encounter for general adult medical examination with abnormal findings: Secondary | ICD-10-CM | POA: Diagnosis not present

## 2016-09-22 DIAGNOSIS — I10 Essential (primary) hypertension: Secondary | ICD-10-CM | POA: Diagnosis not present

## 2016-09-27 ENCOUNTER — Telehealth: Payer: Self-pay | Admitting: Neurology

## 2016-10-03 ENCOUNTER — Other Ambulatory Visit: Payer: Self-pay

## 2016-10-03 MED ORDER — PREGABALIN 150 MG PO CAPS: 150.0000 mg | ORAL_CAPSULE | Freq: Three times a day (TID) | ORAL | 0 refills | Status: DC

## 2016-10-03 NOTE — Telephone Encounter (Signed)
Patient requesting refill for pregabalin (LYRICA) 150 MG capsule. She has appointment with Dr. Leonie Man on 01-09-17. Patient uses [facility] in [place].

## 2016-10-03 NOTE — Telephone Encounter (Signed)
Refill done for lyrica fax to pharmacy.

## 2016-10-17 DIAGNOSIS — G500 Trigeminal neuralgia: Secondary | ICD-10-CM | POA: Diagnosis not present

## 2016-11-10 ENCOUNTER — Other Ambulatory Visit: Payer: Self-pay | Admitting: Neurology

## 2016-11-16 ENCOUNTER — Other Ambulatory Visit: Payer: Self-pay

## 2016-11-16 ENCOUNTER — Telehealth: Payer: Self-pay | Admitting: Neurology

## 2016-11-16 DIAGNOSIS — G500 Trigeminal neuralgia: Secondary | ICD-10-CM | POA: Diagnosis not present

## 2016-11-16 MED ORDER — PREGABALIN 150 MG PO CAPS: 150.0000 mg | ORAL_CAPSULE | Freq: Three times a day (TID) | ORAL | 2 refills | Status: DC

## 2016-11-16 MED ORDER — CARBAMAZEPINE 100 MG PO CHEW: CHEWABLE_TABLET | ORAL | 5 refills | Status: DC

## 2016-11-16 NOTE — Telephone Encounter (Signed)
If patient calls back tegretol refill was done and sent to pharmacy listed. The lyrica was done in May 2018.

## 2016-11-16 NOTE — Telephone Encounter (Signed)
Pt calling for refill of LYRICA 150 MG capsule  carbamazepine (TEGRETOL) 100 MG chewable tablet  please call into  [place], [place]

## 2016-12-17 DIAGNOSIS — G500 Trigeminal neuralgia: Secondary | ICD-10-CM | POA: Diagnosis not present

## 2017-01-05 DIAGNOSIS — E1165 Type 2 diabetes mellitus with hyperglycemia: Secondary | ICD-10-CM | POA: Diagnosis not present

## 2017-01-05 DIAGNOSIS — E782 Mixed hyperlipidemia: Secondary | ICD-10-CM | POA: Diagnosis not present

## 2017-01-05 DIAGNOSIS — I10 Essential (primary) hypertension: Secondary | ICD-10-CM | POA: Diagnosis not present

## 2017-01-05 DIAGNOSIS — E1122 Type 2 diabetes mellitus with diabetic chronic kidney disease: Secondary | ICD-10-CM | POA: Diagnosis not present

## 2017-01-05 DIAGNOSIS — N183 Chronic kidney disease, stage 3 (moderate): Secondary | ICD-10-CM | POA: Diagnosis not present

## 2017-01-08 DIAGNOSIS — Z79891 Long term (current) use of opiate analgesic: Secondary | ICD-10-CM | POA: Diagnosis not present

## 2017-01-08 DIAGNOSIS — E1122 Type 2 diabetes mellitus with diabetic chronic kidney disease: Secondary | ICD-10-CM | POA: Diagnosis not present

## 2017-01-08 DIAGNOSIS — I10 Essential (primary) hypertension: Secondary | ICD-10-CM | POA: Diagnosis not present

## 2017-01-08 DIAGNOSIS — E782 Mixed hyperlipidemia: Secondary | ICD-10-CM | POA: Diagnosis not present

## 2017-01-08 DIAGNOSIS — G500 Trigeminal neuralgia: Secondary | ICD-10-CM | POA: Diagnosis not present

## 2017-01-09 ENCOUNTER — Encounter: Payer: Self-pay | Admitting: Neurology

## 2017-01-09 ENCOUNTER — Ambulatory Visit (INDEPENDENT_AMBULATORY_CARE_PROVIDER_SITE_OTHER): Payer: Medicare Other | Admitting: Neurology

## 2017-01-09 VITALS — BP 113/76 | HR 65 | Ht 59.0 in | Wt 223.0 lb

## 2017-01-09 DIAGNOSIS — G500 Trigeminal neuralgia: Secondary | ICD-10-CM

## 2017-01-09 NOTE — Patient Instructions (Signed)
I had a long discussion with the patient regarding her chronic trigeminal neuralgia which now appears quite stable. I recommend tapering gabapentin to 300 mg 3 times daily for 2 weeks then 300 mg twice daily for 2 weeks and 300 mg daily for 2 weeks and stop if tolerated. If pain does not recur may consider reducing Lyrica at next follow-up visit. Continue carbamazepine 100 mg 3 times daily. She will return for follow-up in 3 months with my nurse practitioner or call earlier if necessary.

## 2017-01-09 NOTE — Progress Notes (Signed)
PATIENT: Kayla Payne DOB: [birth year]  REASON FOR VISIT: follow up HISTORY FROM: patient  HISTORY OF PRESENT ILLNESS:  Today 01/17/16: Kayla Payne is a 71 year old female with a history of trigeminal neuralgia. She returns today for follow-up. She has continued taking carbamazepine 100 mg 3 times a day as well as gabapentin 600 mg 3 times a day. She states that in the last 6 months she's had a flareup every couple months. She states that she will go through cycles of no flareups.  the pain is always on the left side of the face in the left cheek. She states that for the most part carbamazepine and gabapentin offer her good benefit however she is interested in potential surgical options. She denies any new neurological symptoms. She returns today for follow-up.     HISTORY (Kayla Payne):67 year Caucasian lady with left jaw intermittent sharp shooting pain involving left lower jaw mailnly lasting few seconds. At times and for few hours sometimes. Pain is moderate to severe and triggered by cold exposure and used to be relieved by gabapentin which she takes 1200 mg three times daily and tolerates quite well but last 1 year pain is suboptimally relieved and more frequent. She has done relatively well last 2 weeks with no pain. She has not tried carbamezapine, topamax or lyrica. 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 She also takes norco and vicodin for arthritic pain.She had full dental evaluation which was normal.  Update 02/09/14: She returns for followup of her last visit the month ago. She states that Lyrica it did help her for the first to 3 weeks but now the pain seems to be coming back. She gets fairly good relief in the morning when she takes her first dose but lunchtime the pain seems to return. She is not having significant dizziness or other side effects. She has discontinue Topamax and remains on Gabapentin 1200mg  TID. y. She  continues to refuse referral to neurosurgery for, gammaknife or microvascular decompression. Last visit 01/14/2014 : She returns for followup of the last with a month ago. She does not report significant benefit after adding Topamax which she is taking currently at the dose of 50 mg twice daily she is tolerating it well but it has not particularly helped. She remains on t Gabapentin 1200mg  three times daily. She has not tried Lyrica, carbamazepine or baclofen yet. The pain is intermittent but present every time she eats or drinks anything. Pain is severe and intolerable. I discussed with the patient surgical treatment options including trigeminal nerve surgery with gamma knife or microvascular decompression but she is unwilling to discuss surgical options at the present time. Update 03/24/14 : She returns for followup after last visit 6 weeks ago. She again states that she got initial relief after increasing the dose of Lyrica to 150 mg 3 times daily but for the last several weeks the pain has again in intolerable. She'll cannot identify specific triggers. She is able to do most of activities of daily level living but would like better control of her pain. She continues to refuse referral to neurosurgery. She is currently taking gabapentin 1200 mg three times daily and Lyrica 150 mg 3 times daily and tolerating it well without significant side effects.  Update 05/25/14 (LL): She returns for followup after last visit 2 months ago. Since starting Carbamazepine 100 mg 3 times daily her trigeminal pain is relieved. She is currently also  taking gabapentin 1200 mg three times daily and Lyrica 150 mg 3 times daily and tolerating it well without significant side effects. Update 01/12/2015 : She returns for follow-up after last visit the sixth once ago. She states her trigeminal neuralgic pain continues to be well controlled on the current medication regimen of carbamazepine 100 mg 3 times daily as well as gabapentin 303  times daily and Benicar 153 times daily. She still gets the pain around once a day or so but last only a few seconds. It is often triggered by exposure to cold water or food. She has not had any new health problems or any hospital admissions. She is tolerating the current medication regimen without significant side effects. She has no new complaints.   UPDATE 07/15/15: Kayla Payne is a 71 year old female with a history of trigeminal neuralgia. She returns today for follow-up. The patient continues to take carbamazepine 100 mg 3 times a day. She also takes gabapentin 600 mg 3 times a day. Patient reports that she is not had any facial pain. Denies any problems eating or chewing. Overall she feels that she is doing well. She returns today for an evaluation. Update 01/09/2017 : She returns for follow-up after last visit to year and a half ago. She states she'll doing extremely well and has not had trigeminal neuralgic pain now for several months. She remains on carbamazepine 100 mg 3 times daily, gabapentin 650 times daily, Lyrica 150 mg 3 times daily. She is states she is tolerating all his medications well without significant side effects. She also takes Norco for arthritic pain which is prescribed by her primary physician. She uses a cane to ambulate and is quite safe and is no falls or injuries. He would like to reduce her medications if possible. She has no other new complaints today. REVIEW OF SYSTEMS: Out of a complete 14 system review of symptoms, the patient complains only of the following symptoms, and all other reviewed systems are negative.  Hearing loss, restless leg, insomnia, frequent waking, joint pain, back pain, aching muscles, walking difficulty, depression, nervousness and anxiety and all other systems negative  ALLERGIES: Allergies  Allergen Reactions  . Morphine Swelling  . Morphine And Related Other (See Comments) and Rash    HOME MEDICATIONS: Outpatient Medications Prior to Visit    Medication Sig Dispense Refill  . ACCU-CHEK AVIVA PLUS test strip     . ACCU-CHEK SOFTCLIX LANCETS lancets     . amLODipine (NORVASC) 10 MG tablet     . aspirin EC 81 MG tablet Take 81 mg by mouth daily.    . Black Cohosh 40 MG CAPS Take by mouth daily.    . carbamazepine (TEGRETOL) 100 MG chewable tablet ONE TABLET THREE TIMES DAILY 90 tablet 5  . CINNAMON PO Take 2 tablets by mouth daily.    . cycloSPORINE (RESTASIS) 0.05 % ophthalmic emulsion Place 1 drop into both eyes 2 (two) times daily as needed.     Marland Kitchen FLUoxetine (PROZAC) 40 MG capsule Take 40 mg by mouth at bedtime.    . gabapentin (NEURONTIN) 300 MG capsule Take 2 capsules by mouth 3 (three) times daily. Decrease dose to 300 mg three times daily x 2 weeks then twice daily x 2 weeks then once daily x 2 weeks and stop    . HYDROcodone-acetaminophen (NORCO) 10-325 MG per tablet     . KLOR-CON M20 20 MEQ tablet     . liothyronine (CYTOMEL) 25 MCG tablet Take 25 mcg  by mouth every morning.    Marland Kitchen losartan-hydrochlorothiazide (HYZAAR) 100-12.5 MG per tablet Take 1 tablet by mouth daily.    . Magnesium 250 MG TABS Take 1 tablet by mouth daily.    . pregabalin (LYRICA) 150 MG capsule Take 1 capsule (150 mg total) by mouth 3 (three) times daily. 90 capsule 2  . Omega-3 Fatty Acids (FISH OIL) 1200 MG CAPS Take 1,200 mg by mouth daily.    . vitamin B-12 (CYANOCOBALAMIN) 1000 MCG tablet Take 1,000 mcg by mouth daily.    . vitamin C (ASCORBIC ACID) 500 MG tablet Take 1,000 mg by mouth daily.     No facility-administered medications prior to visit.     PAST MEDICAL HISTORY: Past Medical History:  Diagnosis Date  . COPD (chronic obstructive pulmonary disease) ([place])   . Depression   . Diabetes mellitus without complication ([place])   . Hypertension   . Neuralgia    trimenial  . Osteoarthritis   . Shortness of breath     PAST SURGICAL HISTORY: Past Surgical History:  Procedure Laterality Date  . CATARACT EXTRACTION W/PHACO  05/16/2012    Procedure: CATARACT EXTRACTION PHACO AND INTRAOCULAR LENS PLACEMENT (IOC);  Surgeon: Tonny Branch, MD;  Location: [facility];  Service: Ophthalmology;  Laterality: Left;  CDE 9.31  . CATARACT EXTRACTION W/PHACO Right 08/19/2012   Procedure: CATARACT EXTRACTION PHACO AND INTRAOCULAR LENS PLACEMENT (IOC);  Surgeon: Tonny Branch, MD;  Location: [facility];  Service: Ophthalmology;  Laterality: Right;  CDE: 9.46  . CESAREAN SECTION     x2  . CHOLECYSTECTOMY  2000   [facility]  . EXPLORATION MIDDLE EAR     hearing loss- with surgery. subsequently found skull fracture as reason for hearing loss.  . FRACTURE SURGERY     skull fracture repair-behind left ear  . HERNIA REPAIR  2000, 10/28/13   right and left inguinal-[facility]    FAMILY HISTORY: Family History  Problem Relation Age of Onset  . Hyperlipidemia Mother   . Hypertension Mother   . Diabetes Mother   . Hyperlipidemia Father   . Hypertension Father     SOCIAL HISTORY: Social History   Social History  . Marital status: Legally Separated    Spouse name: N/A  . Number of children: 2  . Years of education: 12   Occupational History  . unemployed    Social History Main Topics  . Smoking status: Never Smoker  . Smokeless tobacco: Never Used  . Alcohol use No  . Drug use: No  . Sexual activity: No   Other Topics Concern  . Not on file   Social History Narrative   Patient lives at home with her boyfriend.   Caffeine Use: couple of servings of tea daily   Patient have 2 children.    Patient has a high school education.    Patient is not currently working.   Patient is right handed.            PHYSICAL EXAM  Vitals:   01/09/17 0838  BP: 113/76  Pulse: 65  Weight: 223 lb (101.2 kg)  Height: 4\' 11"  (1.499 m)   Body mass index is 45.04 kg/m.  Generalized: Well developed elderly caucasian lady, in no acute distress , Obese  Neurological examination  Mentation: Alert oriented to time, place, history taking. Follows all commands  speech and language fluent Cranial nerve II-XII: Pupils were equal round reactive to light. Extraocular movements were full, visual field were full on confrontational test. Facial  sensation and strength were normal. Uvula tongue midline. Head turning and shoulder shrug  were normal and symmetric. Motor: The motor testing reveals 5 over 5 strength of all 4 extremities. Good symmetric motor tone is noted throughout.  Sensory: Sensory testing is intact to soft touch on all 4 extremities. No evidence of extinction is noted.  Coordination: Cerebellar testing reveals good finger-nose-finger and heel-to-shin bilaterally.  Gait and station: Gait is slightly unsteady. Uses a cane when ambulating. Tandem gait not attempted Reflexes: Deep tendon reflexes are symmetric and normal bilaterally.   DIAGNOSTIC DATA (LABS, IMAGING, TESTING) - I reviewed patient records, labs, notes, testing and imaging myself where available.  Lab Results  Component Value Date   WBC 7.2 07/15/2015   HGB 14.0 07/15/2015   HCT 42.3 07/15/2015   MCV 96 07/15/2015   PLT 206 07/15/2015      Component Value Date/Time   NA 142 07/15/2015 1029   K 4.4 07/15/2015 1029   CL 101 07/15/2015 1029   CO2 25 07/15/2015 1029   GLUCOSE 114 (H) 07/15/2015 1029   GLUCOSE 127 (H) 06/02/2013 0612   BUN 20 07/15/2015 1029   CREATININE 1.08 (H) 07/15/2015 1029   CALCIUM 9.0 07/15/2015 1029   PROT 6.9 07/15/2015 1029   ALBUMIN 3.8 07/15/2015 1029   AST 12 07/15/2015 1029   ALT 12 07/15/2015 1029   ALKPHOS 128 (H) 07/15/2015 1029   BILITOT 0.3 07/15/2015 1029   GFRNONAA 53 (L) 07/15/2015 1029   GFRAA 61 07/15/2015 1029      ASSESSMENT AND PLAN 71 y.o. year old female  has a past medical history of COPD (chronic obstructive pulmonary disease) ([place]); Depression; Diabetes mellitus without complication ([place]); Hypertension; Neuralgia; Osteoarthritis; and Shortness of breath. here with:  1. Trigeminal neuralgia  I had a long discussion  with the patient regarding her chronic trigeminal neuralgia which now appears quite stable. I recommend tapering gabapentin to 300 mg 3 times daily for 2 weeks then 300 mg twice daily for 2 weeks and 300 mg daily for 2 weeks and stop if tolerated. If pain does not recur may consider reducing Lyrica at next follow-up visit. Continue carbamazepine 100 mg 3 times daily. She will return for follow-up in 3 months with my nurse practitioner or call earlier if necessary.      Antony Contras, MD 01/09/2017, 8:59 AM [facility] [address], [place], [place] [address] [phone]   I reviewed the above note and documentation by the Nurse Practitioner and agree with the history, physical exam, assessment and plan as outlined above. I was immediately available for face-to-face consultation. Star Age, MD, PhD [facility])

## 2017-01-31 ENCOUNTER — Telehealth: Payer: Self-pay | Admitting: Neurology

## 2017-01-31 NOTE — Telephone Encounter (Signed)
Dr.[name] please advise me. Your note states to stop gabapentin. Pt is already on lyrica, tegretol, and norco.

## 2017-01-31 NOTE — Telephone Encounter (Signed)
Pt calling to inform that since she was last in office on 7-3 and Dr Leonie Man cut down her gabapentin (NEURONTIN) 300 MG capsule the pain is increasing to what it was, please call

## 2017-02-01 NOTE — Telephone Encounter (Signed)
I spoke to the patient she had no significant pain on gabapentin 600 mg 3 times daily but after reducing it to 300 mg 3 times daily she is having breakthrough pain hence I recommend she go back to 600 mg 3 times daily. She was advised to keep her scheduled appointment with me in September and to call for prescription refill if needed

## 2017-03-30 DIAGNOSIS — E1122 Type 2 diabetes mellitus with diabetic chronic kidney disease: Secondary | ICD-10-CM | POA: Diagnosis not present

## 2017-03-30 DIAGNOSIS — E1165 Type 2 diabetes mellitus with hyperglycemia: Secondary | ICD-10-CM | POA: Diagnosis not present

## 2017-03-30 DIAGNOSIS — E782 Mixed hyperlipidemia: Secondary | ICD-10-CM | POA: Diagnosis not present

## 2017-03-30 DIAGNOSIS — I10 Essential (primary) hypertension: Secondary | ICD-10-CM | POA: Diagnosis not present

## 2017-04-04 DIAGNOSIS — N183 Chronic kidney disease, stage 3 (moderate): Secondary | ICD-10-CM | POA: Diagnosis not present

## 2017-04-04 DIAGNOSIS — Z23 Encounter for immunization: Secondary | ICD-10-CM | POA: Diagnosis not present

## 2017-04-04 DIAGNOSIS — E782 Mixed hyperlipidemia: Secondary | ICD-10-CM | POA: Diagnosis not present

## 2017-04-04 DIAGNOSIS — I10 Essential (primary) hypertension: Secondary | ICD-10-CM | POA: Diagnosis not present

## 2017-04-04 DIAGNOSIS — G500 Trigeminal neuralgia: Secondary | ICD-10-CM | POA: Diagnosis not present

## 2017-04-04 DIAGNOSIS — E1122 Type 2 diabetes mellitus with diabetic chronic kidney disease: Secondary | ICD-10-CM | POA: Diagnosis not present

## 2017-04-10 NOTE — Progress Notes (Signed)
[facility]  PATIENT: Kayla Payne DOB: February 24, 1946   REASON FOR VISIT: Follow-up for trigeminal neuralgia HISTORY FROM: Patient    HISTORY OF PRESENT ILLNESS:UPDATE 07/15/15: Kayla. Blye is a 71 year old female with a history of trigeminal neuralgia. She returns today for follow-up. The patient continues to take carbamazepine 100 mg 3 times a day. She also takes gabapentin 600 mg 3 times a day. Patient reports that she is not had any facial pain. Denies any problems eating or chewing. Overall she feels that she is doing well. She returns today for an evaluation. Update 01/09/2017 PS: She returns for follow-up after last visit to year and a half ago. She states she'll doing extremely well and has not had trigeminal neuralgic pain now for several months. She remains on carbamazepine 100 mg 3 times daily, gabapentin 650 times daily, Lyrica 150 mg 3 times daily. She is states she is tolerating all his medications well without significant side effects. She also takes Norco for arthritic pain which is prescribed by her primary physician. She uses a cane to ambulate and is quite safe and is no falls or injuries. He would like to reduce her medications if possible. She has no other new complaints today. UPDATE 10/03/2018CM Kayla Payne, 71 year old female returns for follow-up with history of left trigeminal neuralgia. She is previously followed by Ward Givens and Dr. Leonie Man. She reports that her facial pain is in good control. After her last visit Dr. Leonie Man attempted to titrate off of her gabapentin however her facial pain returned. She is currently taking Tegretol 100 mg 3 times a day. Lyrica 150 mg 3 times a day and Neurontin 600 mg 3 times a day. She has never tried to taper her Tegretol. Her last last blood level was subtherapeutic. She uses a cane to ambulate. Denies any falls. She returns for reevaluation.   REVIEW OF SYSTEMS: Full 14 system review of systems performed and  notable only for those listed, all others are neg:  Constitutional: neg  Cardiovascular: neg Ear/Nose/Throat: Hearing loss  Skin: neg Eyes: neg Respiratory: neg Gastroitestinal: Urinary incontinence  Hematology/Lymphatic: neg  Endocrine: neg Musculoskeletal: Walking difficulty Allergy/Immunology: neg Neurological: Left facial pain Psychiatric: Depression and anxiety Sleep : neg   ALLERGIES: Allergies  Allergen Reactions  . Morphine Swelling  . Morphine And Related Other (See Comments) and Rash    HOME MEDICATIONS: Outpatient Medications Prior to Visit  Medication Sig Dispense Refill  . ACCU-CHEK AVIVA PLUS test strip     . ACCU-CHEK SOFTCLIX LANCETS lancets     . amLODipine (NORVASC) 10 MG tablet     . aspirin EC 81 MG tablet Take 81 mg by mouth daily.    . Black Cohosh 40 MG CAPS Take by mouth daily.    . carbamazepine (TEGRETOL) 100 MG chewable tablet ONE TABLET THREE TIMES DAILY 90 tablet 5  . CINNAMON PO Take 2 tablets by mouth daily.    . cycloSPORINE (RESTASIS) 0.05 % ophthalmic emulsion Place 1 drop into both eyes 2 (two) times daily as needed.     Marland Kitchen FLUoxetine (PROZAC) 40 MG capsule Take 40 mg by mouth at bedtime.    . gabapentin (NEURONTIN) 300 MG capsule Take 2 capsules by mouth 3 (three) times daily.     Marland Kitchen HYDROcodone-acetaminophen (NORCO) 10-325 MG tablet Take by mouth.    . liothyronine (CYTOMEL) 25 MCG tablet Take 25 mcg by mouth every morning.    Marland Kitchen losartan-hydrochlorothiazide (HYZAAR) 100-12.5 MG per tablet Take 1  tablet by mouth daily.    . Magnesium 250 MG TABS Take 1 tablet by mouth daily.    . potassium chloride SA (KLOR-CON M20) 20 MEQ tablet Take by mouth.    . pregabalin (LYRICA) 150 MG capsule Take 1 capsule (150 mg total) by mouth 3 (three) times daily. 90 capsule 2  . HYDROcodone-acetaminophen (NORCO) 10-325 MG per tablet     . KLOR-CON M20 20 MEQ tablet      No facility-administered medications prior to visit.     PAST MEDICAL HISTORY: Past  Medical History:  Diagnosis Date  . COPD (chronic obstructive pulmonary disease) ([place])   . Depression   . Diabetes mellitus without complication ([place])   . Hypertension   . Neuralgia    trimenial  . Osteoarthritis   . Shortness of breath     PAST SURGICAL HISTORY: Past Surgical History:  Procedure Laterality Date  . CATARACT EXTRACTION W/PHACO  05/16/2012   Procedure: CATARACT EXTRACTION PHACO AND INTRAOCULAR LENS PLACEMENT (IOC);  Surgeon: Tonny Branch, MD;  Location: [facility];  Service: Ophthalmology;  Laterality: Left;  CDE 9.31  . CATARACT EXTRACTION W/PHACO Right 08/19/2012   Procedure: CATARACT EXTRACTION PHACO AND INTRAOCULAR LENS PLACEMENT (IOC);  Surgeon: Tonny Branch, MD;  Location: [facility];  Service: Ophthalmology;  Laterality: Right;  CDE: 9.46  . CESAREAN SECTION     x2  . CHOLECYSTECTOMY  2000   [facility]  . EXPLORATION MIDDLE EAR     hearing loss- with surgery. subsequently found skull fracture as reason for hearing loss.  . FRACTURE SURGERY     skull fracture repair-behind left ear  . HERNIA REPAIR  2000, 10/28/13   right and left inguinal-[facility]    FAMILY HISTORY: Family History  Problem Relation Age of Onset  . Hyperlipidemia Mother   . Hypertension Mother   . Diabetes Mother   . Hyperlipidemia Father   . Hypertension Father     SOCIAL HISTORY: Social History   Social History  . Marital status: Legally Separated    Spouse name: N/A  . Number of children: 2  . Years of education: 12   Occupational History  . unemployed    Social History Main Topics  . Smoking status: Never Smoker  . Smokeless tobacco: Never Used  . Alcohol use No  . Drug use: No  . Sexual activity: No   Other Topics Concern  . Not on file   Social History Narrative   Patient lives at home with her boyfriend.   Caffeine Use: couple of servings of tea daily   Patient have 2 children.    Patient has a high school education.    Patient is not currently working.   Patient is right handed.            PHYSICAL EXAM  Vitals:   04/11/17 0844  BP: 130/90  Pulse: 85  Weight: 232 lb (105.2 kg)  Height: 4\' 11"  (1.499 m)   Body mass index is 46.86 kg/m.  Generalized: Well developed, obese female in no acute distress  Head: normocephalic and atraumatic,. Oropharynx benign  Neck: Supple, Musculoskeletal: No deformity   Neurological examination   Mentation: Alert oriented to time, place, history taking. Attention span and concentration appropriate. Recent and remote memory intact.  Follows all commands speech and language fluent.   Cranial nerve II-XII: Pupils were equal round reactive to light extraocular movements were full, visual field were full on confrontational test. Facial sensation and strength were normal. hearing was  intact to finger rubbing bilaterally. Uvula tongue midline. head turning and shoulder shrug were normal and symmetric.Tongue protrusion into cheek strength was normal. Motor: normal bulk and tone, full strength in the BUE, BLE, fine finger movements normal,  Sensory: normal and symmetric to light touch, in the upper and lower extremities  Coordination: finger-nose-finger, heel-to-shin bilaterally, no dysmetria Reflexes: Symmetric upper and lower plantar responses were flexor bilaterally. Gait and Station: Rising up from seated position without assistance, gait is mildly unsteady, tandem gait not attempted. Using single-point cane DIAGNOSTIC DATA (LABS, IMAGING, TESTING) - I reviewed patient records, labs, notes, testing and imaging myself where available.  Lab Results  Component Value Date   WBC 7.2 07/15/2015   HGB 14.0 07/15/2015   HCT 42.3 07/15/2015   MCV 96 07/15/2015   PLT 206 07/15/2015      Component Value Date/Time   NA 142 07/15/2015 1029   K 4.4 07/15/2015 1029   CL 101 07/15/2015 1029   CO2 25 07/15/2015 1029   GLUCOSE 114 (H) 07/15/2015 1029   GLUCOSE 127 (H) 06/02/2013 0612   BUN 20 07/15/2015 1029   CREATININE 1.08 (H)  07/15/2015 1029   CALCIUM 9.0 07/15/2015 1029   PROT 6.9 07/15/2015 1029   ALBUMIN 3.8 07/15/2015 1029   AST 12 07/15/2015 1029   ALT 12 07/15/2015 1029   ALKPHOS 128 (H) 07/15/2015 1029   BILITOT 0.3 07/15/2015 1029   GFRNONAA 53 (L) 07/15/2015 1029   GFRAA 61 07/15/2015 1029    ASSESSMENT AND PLAN  71 y.o. year old female  has a past medical history of COPD (chronic obstructive pulmonary disease) ([place]); Depression; Diabetes mellitus without complication ([place]); Hypertension; Neuralgia; Osteoarthritis; and Shortness of breath. Here to follow-up for her trigeminal neuralgia left face.The patient is a current patient of Dr. Leonie Man  who is out of the office today . This note is sent to the work in doctor.     Continue Lyrica 150 mg 3 times daily will refill I recommend she decrease Tegretol 100 mg  To twice daily. If the pain  does not recur we will continue to decrease at her next visit. Continue Neurontin 600 mg 3 times daily Continue to use single-point cane at all times for safe ambulation Follow-up in 6 months I spent 20 min in total face to face time with the patient more than 50% of which was spent counseling and coordination of care, reviewing test results reviewing medications and discussing and reviewing the diagnosis of left trigeminal neuralgia and further treatment options. Purpose of trying to get off some of the medications, Dennie Bible, [facility], [facility], APRN  [facility] [address] [place],  [address] [phone]

## 2017-04-11 ENCOUNTER — Ambulatory Visit (INDEPENDENT_AMBULATORY_CARE_PROVIDER_SITE_OTHER): Payer: Medicare Other | Admitting: Nurse Practitioner

## 2017-04-11 ENCOUNTER — Encounter: Payer: Self-pay | Admitting: Nurse Practitioner

## 2017-04-11 VITALS — BP 130/90 | HR 85 | Ht 59.0 in | Wt 232.0 lb

## 2017-04-11 DIAGNOSIS — G500 Trigeminal neuralgia: Secondary | ICD-10-CM | POA: Diagnosis not present

## 2017-04-11 DIAGNOSIS — R269 Unspecified abnormalities of gait and mobility: Secondary | ICD-10-CM | POA: Diagnosis not present

## 2017-04-11 MED ORDER — CARBAMAZEPINE 100 MG PO CHEW: 100.0000 mg | CHEWABLE_TABLET | Freq: Two times a day (BID) | ORAL | 6 refills | Status: DC

## 2017-04-11 MED ORDER — PREGABALIN 150 MG PO CAPS: 150.0000 mg | ORAL_CAPSULE | Freq: Three times a day (TID) | ORAL | 5 refills | Status: DC

## 2017-04-11 NOTE — Progress Notes (Signed)
Fax confirmation received for lyrica [facility] [phone].

## 2017-04-11 NOTE — Patient Instructions (Addendum)
Continue Lyrica 150 mg 3 times daily will refill Decrease  Tegretol 100 mg 2 times daily Continue Neurontin 600 mg 3 times daily Follow-up in 6 months

## 2017-04-24 NOTE — Progress Notes (Signed)
I reviewed note and agree with plan.   Lakyla Biswas R. Eliazar Olivar, MD  Certified in Neurology, Neurophysiology and Neuroimaging  [facility] [address], [address] [place], [place] [address] [phone]   

## 2017-05-13 IMAGING — MR MR HEAD WO/W CM
8 of 13 series · 19 of 48 positions shown · IV contrast (20ml Multihance)
Comparison: None available at the time of this dictation.

CLINICAL DATA: Initial evaluation for 5 year history of left-sided
trigeminal neuralgia.

EXAM:
MRI HEAD WITHOUT AND WITH CONTRAST
TECHNIQUE: Multiplanar, multiecho pulse sequences of the brain and surrounding
structures were obtained without and with intravenous contrast.
CONTRAST:  10mL MULTIHANCE GADOBENATE DIMEGLUMINE 529 MG/ML IV SOLN

[Series 7: T1 · sagittal · 5.0mm · 0.45mm/px · 2 of 21 slices shown (1 of 2)]
[im 1/21]
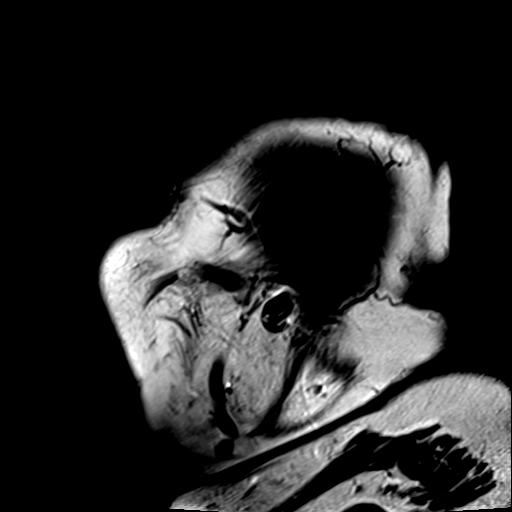
[im 21/21]
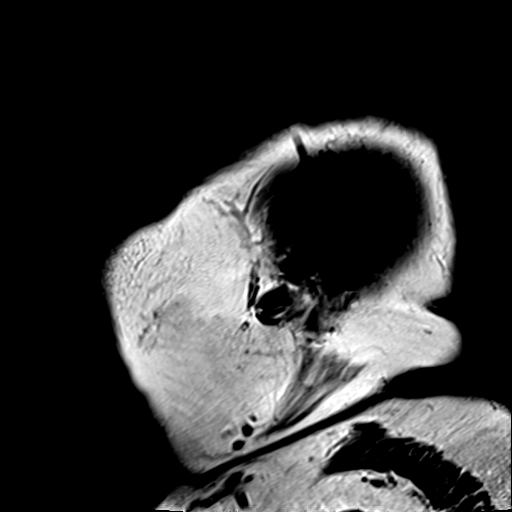

[Series 8: FLAIR · axial · 5.0mm · 0.45mm/px · z∈[-19,+128]mm · 2 of 26 slices shown]
[im 1/26]
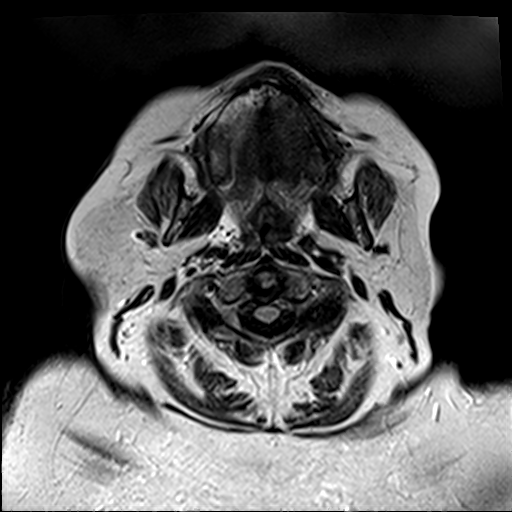
[im 26/26]
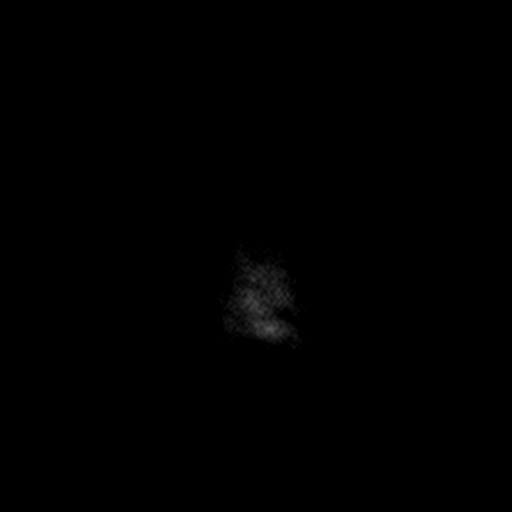

[Series 9: T2 · axial · 5.0mm · 0.60mm/px · z∈[-18,+129]mm · 2 of 26 slices shown (1 of 2)]
[im 1/26]
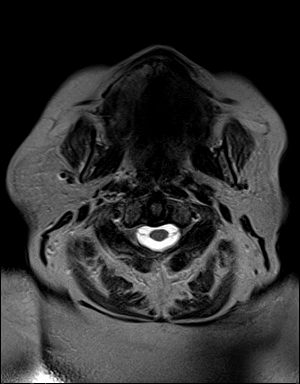
[im 26/26]
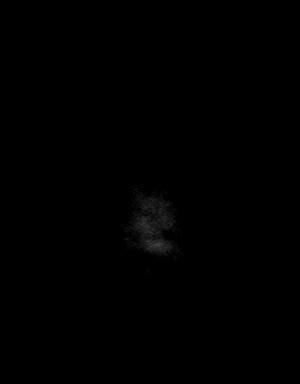

[Series 12: T2 · coronal · 3.0mm · 0.37mm/px · 3 of 40 slices shown (2 of 2)]
[im 1/40]
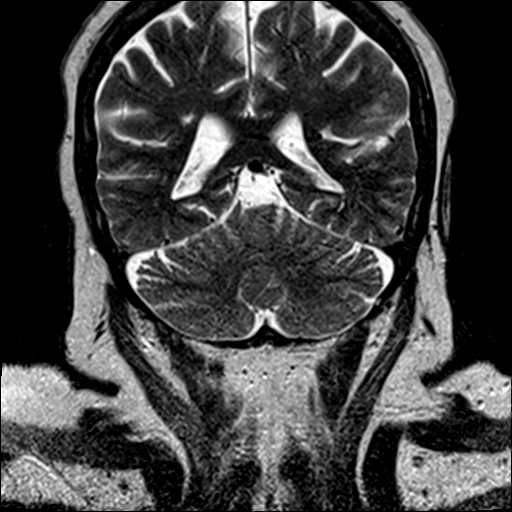
[im 20/40]
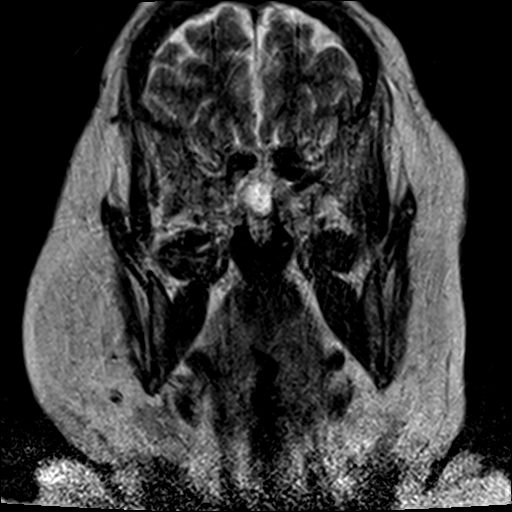
[im 40/40]
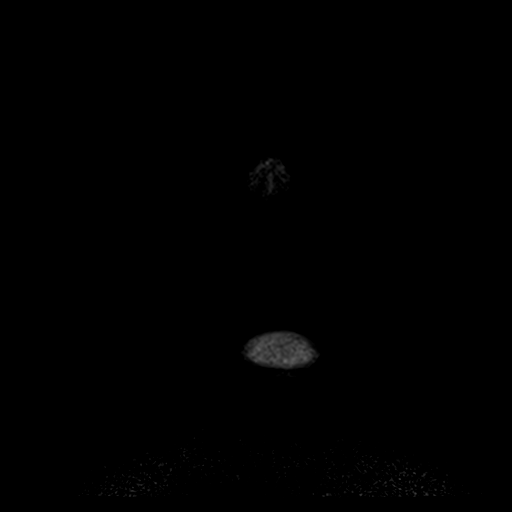

[Series 14: T1 fat-sat · axial · 3.0mm · 0.39mm/px · z∈[-118,+11]mm · 2 of 34 slices shown]
[im 1/34]
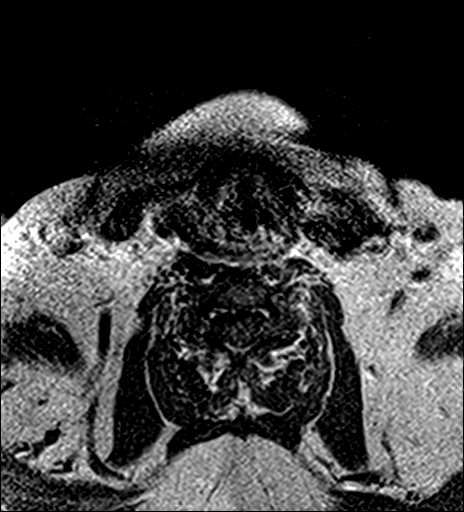
[im 34/34]
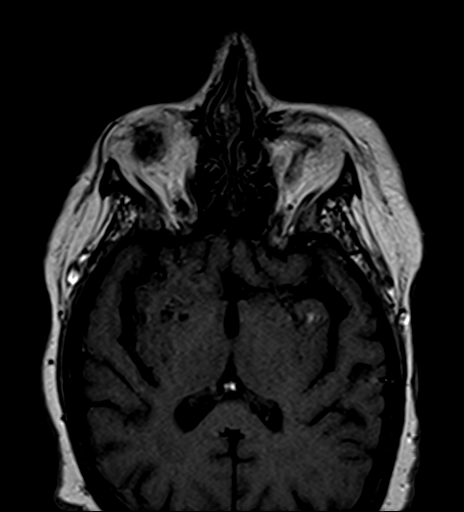

[Series 16: T1 · coronal · 3.0mm · 0.39mm/px · 3 of 40 slices shown (2 of 2)]
[im 1/40]
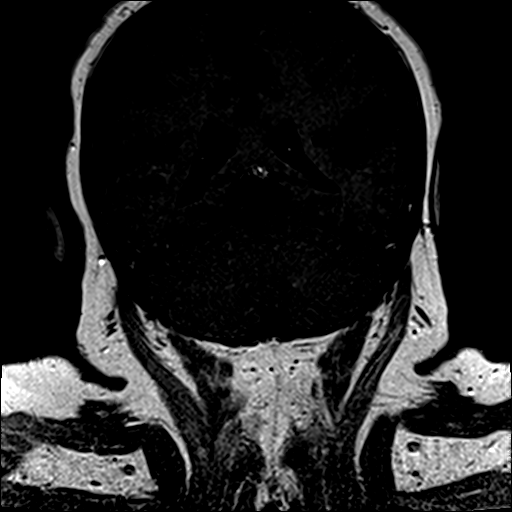
[im 20/40]
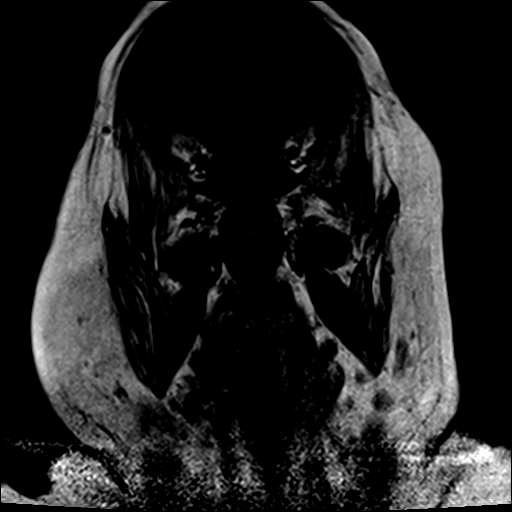
[im 40/40]
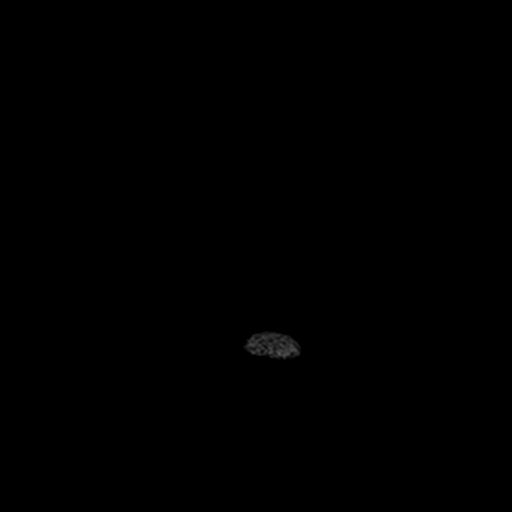

[Series 19: T1 fat-sat post-contrast · coronal · 3.0mm · 0.37mm/px · 3 of 40 slices shown (1 of 2)]
[im 1/40]
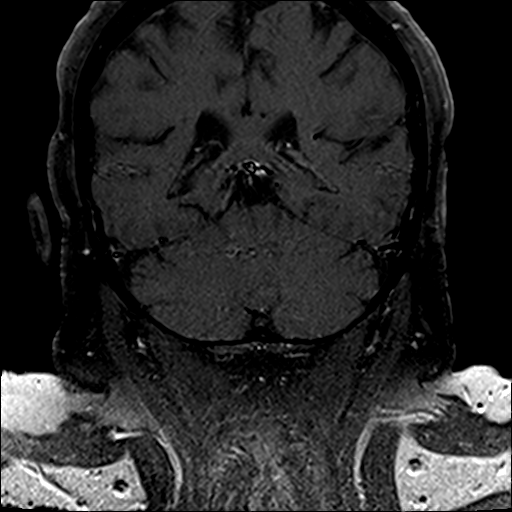
[im 20/40]
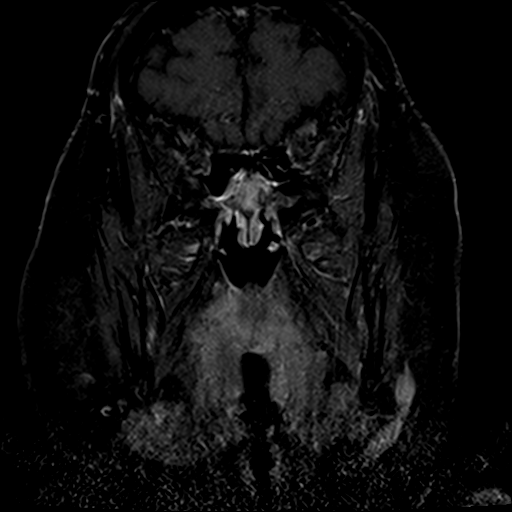
[im 40/40]
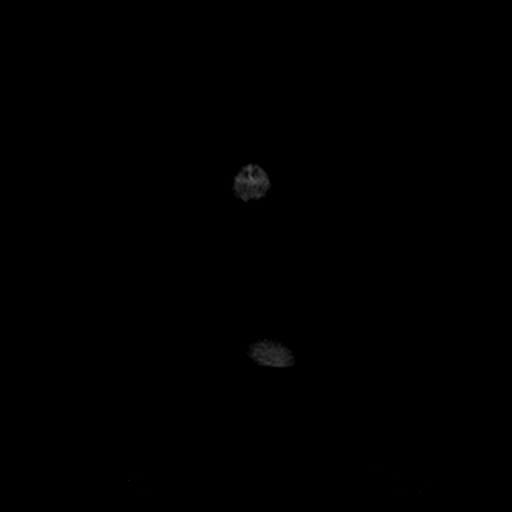

[Series 20: T1 fat-sat post-contrast · axial · 3.0mm · 0.39mm/px · z∈[-118,+10]mm · 2 of 34 slices shown (2 of 2)]
[im 1/34]
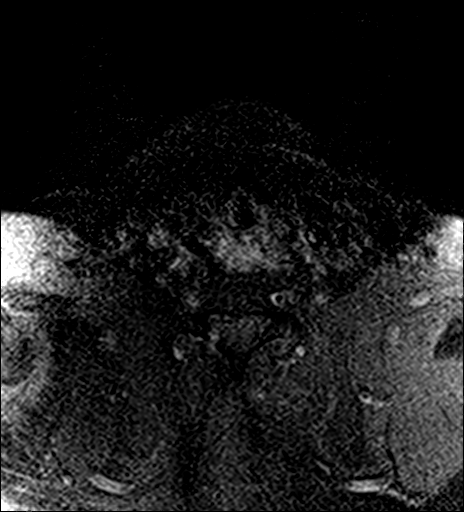
[im 34/34]
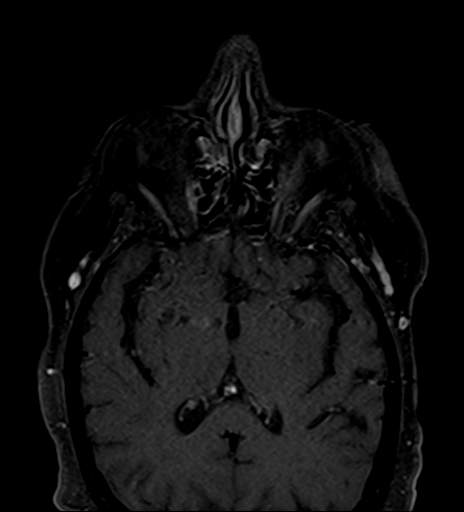

[19 of 48 positions shown; findings below may reference images not displayed]

Per PACs
history, previous MRIs were performed on 01/06/2013 and 12/26/2012,
although these are not currently available for review.
FINDINGS: Diffuse prominence of the CSF containing spaces with prominent
prevascular spaces compatible with underlying cerebral atrophy,
somewhat advanced for patient age. Patchy T2/FLAIR hyperintensity
within the periventricular and deep white matter both cerebral
hemispheres most consistent with chronic small vessel ischemic
disease, moderate in nature. Superimposed small remote lacunar
infarcts involving the basal ganglia. Additional small remote
lacunar infarct within the left pons (series 9, image 9).

No abnormal foci of restricted diffusion to suggest acute or
subacute ischemia. Gray-white matter differentiation maintained.
Major intracranial vascular flow voids are preserved.

No mass lesion, midline shift, or mass effect. No hydrocephalus. No
extra-axial fluid collection. Major dural sinuses are grossly
patent.

Craniocervical junction normal. Mild multilevel degenerative
spondylolysis noted within the visualized upper cervical spine
without significant stenosis. Mild degenerative thickening of the
tectorial membrane.

Pituitary gland within normal limits. Suprasellar cistern normal.
Optic chiasm unremarkable. Globes and orbits within normal limits.
Patient is status post lens extraction bilaterally.

Moderate mucosal thickening within the right sphenoid sinus, mal
with more mild thickening within the left sphenoid sinus. Trace
opacity left mastoid air cells. Inner ear structures grossly normal.

Bone marrow signal intensity within normal limits. No scalp soft
tissue abnormality.

Thin section imaging through the skullbase at the level of the
trigeminal nerves was performed. On the right, the right fifth
cranial nerve is seen exiting the pons normally, coursing through
the pre pontine cistern into Meckel's cave without acute
abnormality. On the left, the root entry zone of the left fifth
cranial nerve is somewhat thin and attenuated with mild lateral
convex bowing. Vascular structure is draped superiorly over the root
entry zone of the pre ganglionic segment of the left fifth cranial
nerve, potentially resulting in a vascular loop syndrome (series 15,
image 111 on axial sequence, series 12, image 9 on coronal
sequence). This vascular structure is favored to reflect the
superior cerebellar artery, although exact certainty is somewhat
difficult, as this may also reflect the left anterior inferior
cerebral artery. A fetal type left PCA is noted. Additionally, there
is suggestion of asymmetric enhancement involving the left pre
ganglionic trigeminal nerve as compared to the right, suggesting
nerve root irritation (series 18, image 21). No enhancing
compressive mass identified. No other findings to explain left-sided
facial pain.
IMPRESSION: 1. Findings suggestive of vascular loop syndrome impinging upon the
pre ganglionic left fifth cranial with resultant left trigeminal
neuralgia as above.
2. Mildly advanced cerebral atrophy for patient age with moderate
chronic microvascular ischemic disease. Scatter remote lacunar
infarcts within the bilateral basal ganglia and left pons.

## 2017-06-28 DIAGNOSIS — I10 Essential (primary) hypertension: Secondary | ICD-10-CM | POA: Diagnosis not present

## 2017-06-28 DIAGNOSIS — E1122 Type 2 diabetes mellitus with diabetic chronic kidney disease: Secondary | ICD-10-CM | POA: Diagnosis not present

## 2017-06-28 DIAGNOSIS — E782 Mixed hyperlipidemia: Secondary | ICD-10-CM | POA: Diagnosis not present

## 2017-06-28 DIAGNOSIS — N183 Chronic kidney disease, stage 3 (moderate): Secondary | ICD-10-CM | POA: Diagnosis not present

## 2017-06-28 DIAGNOSIS — E1165 Type 2 diabetes mellitus with hyperglycemia: Secondary | ICD-10-CM | POA: Diagnosis not present

## 2017-07-04 DIAGNOSIS — E1122 Type 2 diabetes mellitus with diabetic chronic kidney disease: Secondary | ICD-10-CM | POA: Diagnosis not present

## 2017-07-04 DIAGNOSIS — E782 Mixed hyperlipidemia: Secondary | ICD-10-CM | POA: Diagnosis not present

## 2017-07-04 DIAGNOSIS — G500 Trigeminal neuralgia: Secondary | ICD-10-CM | POA: Diagnosis not present

## 2017-07-04 DIAGNOSIS — I10 Essential (primary) hypertension: Secondary | ICD-10-CM | POA: Diagnosis not present

## 2017-09-12 DIAGNOSIS — N183 Chronic kidney disease, stage 3 (moderate): Secondary | ICD-10-CM | POA: Diagnosis not present

## 2017-09-12 DIAGNOSIS — E782 Mixed hyperlipidemia: Secondary | ICD-10-CM | POA: Diagnosis not present

## 2017-09-12 DIAGNOSIS — I10 Essential (primary) hypertension: Secondary | ICD-10-CM | POA: Diagnosis not present

## 2017-09-12 DIAGNOSIS — E1122 Type 2 diabetes mellitus with diabetic chronic kidney disease: Secondary | ICD-10-CM | POA: Diagnosis not present

## 2017-09-12 DIAGNOSIS — E1165 Type 2 diabetes mellitus with hyperglycemia: Secondary | ICD-10-CM | POA: Diagnosis not present

## 2017-09-21 DIAGNOSIS — Z1389 Encounter for screening for other disorder: Secondary | ICD-10-CM | POA: Diagnosis not present

## 2017-09-21 DIAGNOSIS — G500 Trigeminal neuralgia: Secondary | ICD-10-CM | POA: Diagnosis not present

## 2017-09-21 DIAGNOSIS — I10 Essential (primary) hypertension: Secondary | ICD-10-CM | POA: Diagnosis not present

## 2017-09-21 DIAGNOSIS — E782 Mixed hyperlipidemia: Secondary | ICD-10-CM | POA: Diagnosis not present

## 2017-09-21 DIAGNOSIS — E1122 Type 2 diabetes mellitus with diabetic chronic kidney disease: Secondary | ICD-10-CM | POA: Diagnosis not present

## 2017-10-03 DIAGNOSIS — E1122 Type 2 diabetes mellitus with diabetic chronic kidney disease: Secondary | ICD-10-CM | POA: Diagnosis not present

## 2017-10-03 DIAGNOSIS — E782 Mixed hyperlipidemia: Secondary | ICD-10-CM | POA: Diagnosis not present

## 2017-10-03 DIAGNOSIS — E1165 Type 2 diabetes mellitus with hyperglycemia: Secondary | ICD-10-CM | POA: Diagnosis not present

## 2017-10-03 DIAGNOSIS — J9611 Chronic respiratory failure with hypoxia: Secondary | ICD-10-CM | POA: Diagnosis not present

## 2017-10-03 DIAGNOSIS — N184 Chronic kidney disease, stage 4 (severe): Secondary | ICD-10-CM | POA: Diagnosis not present

## 2017-10-03 DIAGNOSIS — Z9189 Other specified personal risk factors, not elsewhere classified: Secondary | ICD-10-CM | POA: Diagnosis not present

## 2017-10-09 NOTE — Progress Notes (Addendum)
[facility]  PATIENT: Kayla Payne DOB: 1945/08/28   REASON FOR VISIT: Follow-up for trigeminal neuralgia on the left HISTORY FROM: Patient    HISTORY OF PRESENT ILLNESS:UPDATE 07/15/15: Kayla Payne is a 72 year old female with a history of trigeminal neuralgia. She returns today for follow-up. The patient continues to take carbamazepine 100 mg 3 times a day. She also takes gabapentin 600 mg 3 times a day. Patient reports that she is not had any facial pain. Denies any problems eating or chewing. Overall she feels that she is doing well. She returns today for an evaluation. Update 01/09/2017 PS: She returns for follow-up after last visit to year and a half ago. She states she'll doing extremely well and has not had trigeminal neuralgic pain now for several months. She remains on carbamazepine 100 mg 3 times daily, gabapentin 650 times daily, Lyrica 150 mg 3 times daily. She is states she is tolerating all his medications well without significant side effects. She also takes Norco for arthritic pain which is prescribed by her primary physician. She uses a cane to ambulate and is quite safe and is no falls or injuries. He would like to reduce her medications if possible. She has no other new complaints today. UPDATE 10/03/2018CM Kayla Payne, 72 year old female returns for follow-up with history of left trigeminal neuralgia. She is previously followed by [facility] Givens and Dr. Leonie Man. She reports that her facial pain is in good control. After her last visit Dr. Leonie Man attempted to titrate off of her gabapentin however her facial pain returned. She is currently taking Tegretol 100 mg 3 times a day. Lyrica 150 mg 3 times a day and Neurontin 600 mg 3 times a day. She has never tried to taper her Tegretol. Her last last blood level was subtherapeutic. She uses a cane to ambulate. Denies any falls. She returns for reevaluation.  UPDATE 4/3/2019CM Kayla. 32, 72 year old female returns for  follow-up with a history of left trigeminal neuralgia.  She still has periods of left facial pain intermittently.  She has had 2 falls in the last 2 weeks walking in her backyard.  No apparent injury.  She says she was walking with a cane.  She is attempted to titrate off of gabapentin in the past but her facial pain returned.  She is also on Lyrica.  She is also on Tegretol and that was decreased to twice daily when last seen.  No other interval medical issues.  Blood pressure noted to be elevated today, she states it was due to the fact of being told at the front desk that her Medicaid had expired.  She returns for reevaluation REVIEW OF SYSTEMS: Full 14 system review of systems performed and notable only for those listed, all others are neg:  Constitutional: neg  Cardiovascular: neg Ear/Nose/Throat: neg Skin: neg Eyes: neg Respiratory: Shortness of breath Gastroitestinal: Urinary incontinence  Hematology/Lymphatic: neg  Endocrine: neg Musculoskeletal: Walking difficulty, joint pain Allergy/Immunology: neg Neurological: Left facial pain Psychiatric: Depression and anxiety Sleep : neg   ALLERGIES: Allergies  Allergen Reactions  . Morphine Swelling  . Morphine And Related Other (See Comments) and Rash    HOME MEDICATIONS: Outpatient Medications Prior to Visit  Medication Sig Dispense Refill  . ACCU-CHEK AVIVA PLUS test strip     . ACCU-CHEK SOFTCLIX LANCETS lancets     . amLODipine (NORVASC) 10 MG tablet     . amLODipine (NORVASC) 10 MG tablet Take 10 mg by mouth daily.     Marland Kitchen  aspirin EC 81 MG tablet Take 81 mg by mouth daily.    . Black Cohosh 40 MG CAPS Take by mouth daily.    Marland Kitchen buPROPion (WELLBUTRIN XL) 300 MG 24 hr tablet Take 300 mg by mouth daily.     . carbamazepine (TEGRETOL) 100 MG chewable tablet Chew 1 tablet (100 mg total) by mouth 2 (two) times daily. 60 tablet 6  . CINNAMON PO Take 2 tablets by mouth daily.    . cycloSPORINE (RESTASIS) 0.05 % ophthalmic emulsion  Place 1 drop into both eyes 2 (two) times daily as needed.     Marland Kitchen FLUoxetine (PROZAC) 40 MG capsule Take 40 mg by mouth at bedtime.    . gabapentin (NEURONTIN) 300 MG capsule Take 2 capsules by mouth 3 (three) times daily.     Marland Kitchen HYDROcodone-acetaminophen (NORCO) 10-325 MG tablet Take by mouth.    . Magnesium 250 MG TABS Take 1 tablet by mouth daily.    . potassium chloride SA (KLOR-CON M20) 20 MEQ tablet Take by mouth.    . pregabalin (LYRICA) 150 MG capsule Take 1 capsule (150 mg total) by mouth 3 (three) times daily. 90 capsule 5  . liothyronine (CYTOMEL) 25 MCG tablet Take 25 mcg by mouth every morning.    Marland Kitchen losartan-hydrochlorothiazide (HYZAAR) 100-12.5 MG per tablet Take 1 tablet by mouth daily.     No facility-administered medications prior to visit.     PAST MEDICAL HISTORY: Past Medical History:  Diagnosis Date  . COPD (chronic obstructive pulmonary disease) ([place])   . Depression   . Diabetes mellitus without complication ([place])   . Hypertension   . Neuralgia    trimenial  . Osteoarthritis   . Shortness of breath     PAST SURGICAL HISTORY: Past Surgical History:  Procedure Laterality Date  . CATARACT EXTRACTION W/PHACO  05/16/2012   Procedure: CATARACT EXTRACTION PHACO AND INTRAOCULAR LENS PLACEMENT (IOC);  Surgeon: Tonny Branch, MD;  Location: [facility];  Service: Ophthalmology;  Laterality: Left;  CDE 9.31  . CATARACT EXTRACTION W/PHACO Right 08/19/2012   Procedure: CATARACT EXTRACTION PHACO AND INTRAOCULAR LENS PLACEMENT (IOC);  Surgeon: Tonny Branch, MD;  Location: [facility];  Service: Ophthalmology;  Laterality: Right;  CDE: 9.46  . CESAREAN SECTION     x2  . CHOLECYSTECTOMY  2000   [facility]  . EXPLORATION MIDDLE EAR     hearing loss- with surgery. subsequently found skull fracture as reason for hearing loss.  . FRACTURE SURGERY     skull fracture repair-behind left ear  . HERNIA REPAIR  2000, 10/28/13   right and left inguinal-[facility]    FAMILY HISTORY: Family History  Problem  Relation Age of Onset  . Hyperlipidemia Mother   . Hypertension Mother   . Diabetes Mother   . Hyperlipidemia Father   . Hypertension Father     SOCIAL HISTORY: Social History   Socioeconomic History  . Marital status: Legally Separated    Spouse name: Not on file  . Number of children: 2  . Years of education: 43  . Highest education level: Not on file  Occupational History  . Occupation: unemployed  Social Needs  . Financial resource strain: Not on file  . Food insecurity:    Worry: Not on file    Inability: Not on file  . Transportation needs:    Medical: Not on file    Non-medical: Not on file  Tobacco Use  . Smoking status: Never Smoker  . Smokeless tobacco: Never Used  Substance and Sexual Activity  . Alcohol use: No    Alcohol/week: 0.0 oz  . Drug use: No  . Sexual activity: Never    Birth control/protection: Post-menopausal  Lifestyle  . Physical activity:    Days per week: Not on file    Minutes per session: Not on file  . Stress: Not on file  Relationships  . Social connections:    Talks on phone: Not on file    Gets together: Not on file    Attends religious service: Not on file    Active member of club or organization: Not on file    Attends meetings of clubs or organizations: Not on file    Relationship status: Not on file  . Intimate partner violence:    Fear of current or ex partner: Not on file    Emotionally abused: Not on file    Physically abused: Not on file    Forced sexual activity: Not on file  Other Topics Concern  . Not on file  Social History Narrative   Patient lives at home with her boyfriend.   Caffeine Use: couple of servings of tea daily   Patient have 2 children.    Patient has a high school education.    Patient is not currently working.   Patient is right handed.        PHYSICAL EXAM  Vitals:   10/10/17 0828  BP: (!) 158/92  Pulse: 82  Weight: 232 lb 12.8 oz (105.6 kg)  Height: 4\' 11"  (1.499 m)   Body mass  index is 47.02 kg/m.  Generalized: Well developed, obese female in no acute distress  Head: normocephalic and atraumatic,. Oropharynx benign  Neck: Supple, Musculoskeletal: No deformity   Neurological examination   Mentation: Alert oriented to time, place, history taking. Attention span and concentration appropriate. Recent and remote memory intact.  Follows all commands speech and language fluent.   Cranial nerve II-XII: Pupils were equal round reactive to light extraocular movements were full, visual field were full on confrontational test. Facial sensation and strength were normal. hearing was intact to finger rubbing bilaterally. Uvula tongue midline. head turning and shoulder shrug were normal and symmetric.Tongue protrusion into cheek strength was normal. Motor: normal bulk and tone, full strength in the BUE, BLE,  Sensory: normal and symmetric to light touch, in the upper and lower extremities  Coordination: finger-nose-finger, heel-to-shin bilaterally, no dysmetria, no tremor Reflexes: Symmetric upper and lower plantar responses were flexor bilaterally. Gait and Station: Rising up from seated position without assistance, stooped posture gait is mildly unsteady, tandem gait not attempted. Using single-point cane DIAGNOSTIC DATA (LABS, IMAGING, TESTING) - I reviewed patient records, labs, notes, testing and imaging myself where available.  Lab Results  Component Value Date   WBC 7.2 07/15/2015   HGB 14.0 07/15/2015   HCT 42.3 07/15/2015   MCV 96 07/15/2015   PLT 206 07/15/2015      Component Value Date/Time   NA 142 07/15/2015 1029   K 4.4 07/15/2015 1029   CL 101 07/15/2015 1029   CO2 25 07/15/2015 1029   GLUCOSE 114 (H) 07/15/2015 1029   GLUCOSE 127 (H) 06/02/2013 0612   BUN 20 07/15/2015 1029   CREATININE 1.08 (H) 07/15/2015 1029   CALCIUM 9.0 07/15/2015 1029   PROT 6.9 07/15/2015 1029   ALBUMIN 3.8 07/15/2015 1029   AST 12 07/15/2015 1029   ALT 12 07/15/2015 1029     ALKPHOS 128 (H) 07/15/2015 1029   BILITOT  0.3 07/15/2015 1029   GFRNONAA 53 (L) 07/15/2015 1029   GFRAA 61 07/15/2015 1029    ASSESSMENT AND PLAN  72 y.o. year old female  has a past medical history of COPD (chronic obstructive pulmonary disease) ([place]); Depression; Diabetes mellitus without complication ([place]); Hypertension; Neuralgia; Osteoarthritis; and Shortness of breath. Here to follow-up for her trigeminal neuralgia left face.The patient is a current patient of Dr. Leonie Man  who is out of the office today . This note is sent to the work in doctor.     PLAN: Continue Lyrica 150 mg 3 times daily will refill We will check Tegretol level   Recommend she decrease Tegretol 100 mg   If the pain  does not recur we will continue to decrease at her next visit.or stop based on lab Continue Neurontin 600 mg 3 times daily Continue to use single-point cane at all times for safe ambulation at risk for falls due to multiple factors Follow-up in 8 months I spent 20 min in total face to face time with the patient more than 50% of which was spent counseling and coordination of care, reviewing test results reviewing medications and discussing and reviewing the diagnosis of left trigeminal neuralgia and further treatment options.  Dennie Bible, [facility], [facility], APRN  [facility] [address], [place], [place] [address] [phone]  I reviewed the above note and documentation by the Nurse Practitioner and generally agree with the history, physical exam, assessment and plan as outlined above with the following additional comments:  I would like for patient to be tapered down on her sedating medications in light of recent falls. She is on high-dose Lyrica as well as high-dose gabapentin, and tegretol. Her med list also includes Norco as needed as well as Wellbutrin 300 mg. All of these medications are potentially sedating and could affect her balance and increase her fall risk  significantly. I would recommend follow up soon with Dr. Leonie Man to address these concerns. I was immediately available for face-to-face consultation. Star Age, MD, PhD [facility])

## 2017-10-10 ENCOUNTER — Ambulatory Visit (INDEPENDENT_AMBULATORY_CARE_PROVIDER_SITE_OTHER): Payer: Medicare Other | Admitting: Nurse Practitioner

## 2017-10-10 ENCOUNTER — Encounter (INDEPENDENT_AMBULATORY_CARE_PROVIDER_SITE_OTHER): Payer: Self-pay

## 2017-10-10 ENCOUNTER — Encounter: Payer: Self-pay | Admitting: Nurse Practitioner

## 2017-10-10 VITALS — BP 158/92 | HR 82 | Ht 59.0 in | Wt 232.8 lb

## 2017-10-10 DIAGNOSIS — G500 Trigeminal neuralgia: Secondary | ICD-10-CM | POA: Diagnosis not present

## 2017-10-10 DIAGNOSIS — R262 Difficulty in walking, not elsewhere classified: Secondary | ICD-10-CM

## 2017-10-10 DIAGNOSIS — Z5181 Encounter for therapeutic drug level monitoring: Secondary | ICD-10-CM | POA: Diagnosis not present

## 2017-10-10 DIAGNOSIS — R269 Unspecified abnormalities of gait and mobility: Secondary | ICD-10-CM

## 2017-10-10 MED ORDER — PREGABALIN 150 MG PO CAPS: 150.0000 mg | ORAL_CAPSULE | Freq: Three times a day (TID) | ORAL | 5 refills | Status: DC

## 2017-10-10 NOTE — Progress Notes (Signed)
Lyrica refill Rx successfully faxed to [facility], [place], [place]

## 2017-10-10 NOTE — Patient Instructions (Signed)
Continue Lyrica 150 mg 3 times daily will refill We will check Tegretol level   recommend she decrease Tegretol 100 mg   If the pain  does not recur we will continue to decrease at her next visit. Continue Neurontin 600 mg 3 times daily Continue to use single-point cane at all times for safe ambulation at risk for falls Follow-up in 8 months

## 2017-10-11 LAB — CARBAMAZEPINE LEVEL, TOTAL: Carbamazepine (Tegretol), S: 2.6 ug/mL — ABNORMAL LOW (ref 4.0–12.0)

## 2017-10-12 ENCOUNTER — Telehealth: Payer: Self-pay | Admitting: *Deleted

## 2017-10-12 NOTE — Telephone Encounter (Signed)
-----   Message from Dennie Bible, NP sent at 10/11/2017  3:18 PM EDT ----- Patient may discontinue Tegretol.  Per Dr. Tori Milks    addended note yesterday she would pt to be seen by Dr. Leonie Man in next few months to continue tapering of her meds .

## 2017-10-12 NOTE — Telephone Encounter (Signed)
Spoke to pt and relayed that she is to stop tegretol(carbamazepine) per CM/NP and made appt with Dr. Leonie Man in 12-25-17 at 0830 arrive at 0800, for med management. She verbalized understanding.

## 2017-11-14 ENCOUNTER — Encounter: Payer: Self-pay | Admitting: Neurology

## 2017-11-24 DIAGNOSIS — G500 Trigeminal neuralgia: Secondary | ICD-10-CM | POA: Diagnosis not present

## 2017-11-24 DIAGNOSIS — E782 Mixed hyperlipidemia: Secondary | ICD-10-CM | POA: Diagnosis not present

## 2017-11-24 DIAGNOSIS — E1122 Type 2 diabetes mellitus with diabetic chronic kidney disease: Secondary | ICD-10-CM | POA: Diagnosis not present

## 2017-11-24 DIAGNOSIS — I10 Essential (primary) hypertension: Secondary | ICD-10-CM | POA: Diagnosis not present

## 2017-12-25 ENCOUNTER — Ambulatory Visit: Payer: Self-pay | Admitting: Neurology

## 2017-12-27 DIAGNOSIS — Z9189 Other specified personal risk factors, not elsewhere classified: Secondary | ICD-10-CM | POA: Diagnosis not present

## 2017-12-27 DIAGNOSIS — E1122 Type 2 diabetes mellitus with diabetic chronic kidney disease: Secondary | ICD-10-CM | POA: Diagnosis not present

## 2017-12-27 DIAGNOSIS — I10 Essential (primary) hypertension: Secondary | ICD-10-CM | POA: Diagnosis not present

## 2017-12-27 DIAGNOSIS — E782 Mixed hyperlipidemia: Secondary | ICD-10-CM | POA: Diagnosis not present

## 2017-12-27 DIAGNOSIS — E1165 Type 2 diabetes mellitus with hyperglycemia: Secondary | ICD-10-CM | POA: Diagnosis not present

## 2017-12-31 DIAGNOSIS — Z79891 Long term (current) use of opiate analgesic: Secondary | ICD-10-CM | POA: Diagnosis not present

## 2017-12-31 DIAGNOSIS — Z23 Encounter for immunization: Secondary | ICD-10-CM | POA: Diagnosis not present

## 2017-12-31 DIAGNOSIS — I10 Essential (primary) hypertension: Secondary | ICD-10-CM | POA: Diagnosis not present

## 2017-12-31 DIAGNOSIS — E1122 Type 2 diabetes mellitus with diabetic chronic kidney disease: Secondary | ICD-10-CM | POA: Diagnosis not present

## 2017-12-31 DIAGNOSIS — E782 Mixed hyperlipidemia: Secondary | ICD-10-CM | POA: Diagnosis not present

## 2017-12-31 DIAGNOSIS — Z0001 Encounter for general adult medical examination with abnormal findings: Secondary | ICD-10-CM | POA: Diagnosis not present

## 2018-01-09 ENCOUNTER — Ambulatory Visit: Payer: Self-pay | Admitting: Neurology

## 2018-02-04 ENCOUNTER — Telehealth: Payer: Self-pay | Admitting: *Deleted

## 2018-02-04 ENCOUNTER — Telehealth: Payer: Self-pay | Admitting: Nurse Practitioner

## 2018-02-04 NOTE — Telephone Encounter (Signed)
Patient requesting refill of  pregabalin (LYRICA) 150 MG capsule sent to [facility] in [place].

## 2018-02-04 NOTE — Telephone Encounter (Signed)
PA for Lyrica started on CMM.

## 2018-02-04 NOTE — Telephone Encounter (Addendum)
Spoke with patient and advised her she is asking for a refill too soon. She should have enough Lyrica until Oct. She stated the pharmacy told her the dr's authorization had run out. Called Dawn at [facility] who stated the patient does have refills on file, however her insurance is now asking for a PA. Called patient and advised her that the medication now requires a PA per her insurance, and it can take a few days. She will be notified by this RN or pharmacy when PA is received.  She verbalized understanding, appreciation.

## 2018-02-05 ENCOUNTER — Encounter: Payer: Self-pay | Admitting: *Deleted

## 2018-02-05 NOTE — Telephone Encounter (Signed)
Appeal letter faxed to [employer] and [employer]

## 2018-02-05 NOTE — Telephone Encounter (Signed)
Lyrica denied per Medicare. Stated Lyrica is not FDA approved for her medical condition, trigeminal neuralgia . Appeal letter written, will be faxed when signed.

## 2018-02-14 NOTE — Telephone Encounter (Addendum)
Made 4 attempts to find out status of PA on Lyrica. Was never able to speak to a person. Two calls were cut off, other two calls were never picked up when automated system stated the call would be transferred.  [employer], spoke with pharmacist. She stated that Lyrica is generic, so they ran generic through her insurance. It went through and the patient has already picked up the prescription.

## 2018-02-21 ENCOUNTER — Ambulatory Visit (INDEPENDENT_AMBULATORY_CARE_PROVIDER_SITE_OTHER): Payer: Medicare Other | Admitting: Neurology

## 2018-02-21 ENCOUNTER — Encounter: Payer: Self-pay | Admitting: Neurology

## 2018-02-21 VITALS — BP 143/85 | HR 68 | Wt 228.6 lb

## 2018-02-21 DIAGNOSIS — G500 Trigeminal neuralgia: Secondary | ICD-10-CM

## 2018-02-21 MED ORDER — PREGABALIN 150 MG PO CAPS: 150.0000 mg | ORAL_CAPSULE | Freq: Three times a day (TID) | ORAL | 5 refills | Status: DC

## 2018-02-21 MED ORDER — GABAPENTIN 300 MG PO CAPS: 600.0000 mg | ORAL_CAPSULE | Freq: Three times a day (TID) | ORAL | 11 refills | Status: DC

## 2018-02-21 NOTE — Progress Notes (Signed)
[facility]  PATIENT: Kayla Payne DOB: [birth year]   REASON FOR VISIT: Follow-up for trigeminal neuralgia on the left HISTORY FROM: Patient    HISTORY OF PRESENT ILLNESS:UPDATE 07/15/15: Ms. Kayla Payne is a 72 year old female with a history of trigeminal neuralgia. She returns today for follow-up. The patient continues to take carbamazepine 100 mg 3 times a day. She also takes gabapentin 600 mg 3 times a day. Patient reports that she is not had any facial pain. Denies any problems eating or chewing. Overall she feels that she is doing well. She returns today for an evaluation. Update 01/09/2017 PS: She returns for follow-up after last visit to year and a half ago. She states she'll doing extremely well and has not had trigeminal neuralgic pain now for several months. She remains on carbamazepine 100 mg 3 times daily, gabapentin 650 times daily, Lyrica 150 mg 3 times daily. She is states she is tolerating all his medications well without significant side effects. She also takes Norco for arthritic pain which is prescribed by her primary physician. She uses a cane to ambulate and is quite safe and is no falls or injuries. He would like to reduce her medications if possible. She has no other new complaints today. UPDATE 10/03/2018CM Ms Kayla Payne, 72 year old female returns for follow-up with history of left trigeminal neuralgia. She is previously followed by Ward Givens and Dr. Leonie Man. She reports that her facial pain is in good control. After her last visit Dr. Leonie Man attempted to titrate off of her gabapentin however her facial pain returned. She is currently taking Tegretol 100 mg 3 times a day. Lyrica 150 mg 3 times a day and Neurontin 600 mg 3 times a day. She has never tried to taper her Tegretol. Her last last blood level was subtherapeutic. She uses a cane to ambulate. Denies any falls. She returns for reevaluation.  UPDATE 4/3/2019CM Ms. 6, 72 year old female returns for  follow-up with a history of left trigeminal neuralgia.  She still has periods of left facial pain intermittently.  She has had 2 falls in the last 2 weeks walking in her backyard.  No apparent injury.  She says she was walking with a cane.  She is attempted to titrate off of gabapentin in the past but her facial pain returned.  She is also on Lyrica.  She is also on Tegretol and that was decreased to twice daily when last seen.  No other interval medical issues.  Blood pressure noted to be elevated today, she states it was due to the fact of being told at the front desk that her Medicaid had expired.  She returns for reevaluation Update 02/21/2018 ; she returns for follow-up after last visit with Kayla Payne, nurse practitioner 4 months ago. She has tapered and discontinued carbamazepine 3 months ago and has not noticed any significant worsening of her trigeminal neuralgic pain which still occurs intermittently about once a week or so. She does take Lyrica 150 mg 3 times daily and gabapentin 600 mg 3 times daily which seemed to work quite well. She is tolerating both medications without any side effects. She had, well-appearing level checked on 10/10/2017 prior to discontinuing it and was low at 2.6. Patient said she was referred to neurosurgeon for surgical treatment of carpal trigeminal neuralgia in the past but since her symptoms spontaneously improved receive chose not to undergo surgery. She had recent changes in her blood pressure medication Norvasc was discontinued and replaced with lisinopril and blood pressure  is doing better and today it is 143/85. She has no new complaints. REVIEW OF SYSTEMS: Full 14 system review of systems performed and notable only for those listed, all others are neg:   Runny nose, shortness of breath, incontinence of bladder, restless leg, frequent waking, back pain, aching muscles, walking difficulty, skin itching, depression, no masses anxiety and all other systems  negative  ALLERGIES: Allergies  Allergen Reactions  . Morphine Swelling  . Morphine And Related Other (See Comments) and Rash    HOME MEDICATIONS: Outpatient Medications Prior to Visit  Medication Sig Dispense Refill  . ACCU-CHEK SOFTCLIX LANCETS lancets     . amLODipine (NORVASC) 10 MG tablet Take 10 mg by mouth daily.     Marland Kitchen aspirin EC 81 MG tablet Take 81 mg by mouth daily.    . Black Cohosh 40 MG CAPS Take by mouth daily.    Marland Kitchen buPROPion (WELLBUTRIN XL) 300 MG 24 hr tablet Take 300 mg by mouth daily.     Marland Kitchen CINNAMON PO Take 2 tablets by mouth daily.    . cycloSPORINE (RESTASIS) 0.05 % ophthalmic emulsion Place 1 drop into both eyes 2 (two) times daily as needed.     Marland Kitchen FLUoxetine (PROZAC) 40 MG capsule Take 40 mg by mouth at bedtime.    Marland Kitchen HYDROcodone-acetaminophen (NORCO) 10-325 MG tablet Take by mouth.    Marland Kitchen lisinopril (PRINIVIL,ZESTRIL) 10 MG tablet Take 10 mg by mouth daily.    Marland Kitchen gabapentin (NEURONTIN) 300 MG capsule Take 2 capsules by mouth 3 (three) times daily.     . pregabalin (LYRICA) 150 MG capsule Take 1 capsule (150 mg total) by mouth 3 (three) times daily. 90 capsule 5  . ACCU-CHEK AVIVA PLUS test strip     . FLUoxetine (PROZAC) 40 MG capsule Take by mouth.    . Magnesium 250 MG TABS Take 1 tablet by mouth daily.    . potassium chloride SA (KLOR-CON M20) 20 MEQ tablet Take by mouth.     No facility-administered medications prior to visit.     PAST MEDICAL HISTORY: Past Medical History:  Diagnosis Date  . COPD (chronic obstructive pulmonary disease) ([place])   . Depression   . Hypertension   . Neuralgia    trimenial  . Osteoarthritis   . Shortness of breath     PAST SURGICAL HISTORY: Past Surgical History:  Procedure Laterality Date  . CATARACT EXTRACTION W/PHACO  05/16/2012   Procedure: CATARACT EXTRACTION PHACO AND INTRAOCULAR LENS PLACEMENT (IOC);  Surgeon: Tonny Branch, MD;  Location: [facility];  Service: Ophthalmology;  Laterality: Left;  CDE 9.31  . CATARACT  EXTRACTION W/PHACO Right 08/19/2012   Procedure: CATARACT EXTRACTION PHACO AND INTRAOCULAR LENS PLACEMENT (IOC);  Surgeon: Tonny Branch, MD;  Location: [facility];  Service: Ophthalmology;  Laterality: Right;  CDE: 9.46  . CESAREAN SECTION     x2  . CHOLECYSTECTOMY  2000   [facility]  . EXPLORATION MIDDLE EAR     hearing loss- with surgery. subsequently found skull fracture as reason for hearing loss.  . FRACTURE SURGERY     skull fracture repair-behind left ear  . HERNIA REPAIR  2000, 10/28/13   right and left inguinal-[facility]    FAMILY HISTORY: Family History  Problem Relation Age of Onset  . Hyperlipidemia Mother   . Hypertension Mother   . Diabetes Mother   . Hyperlipidemia Father   . Hypertension Father     SOCIAL HISTORY: Social History   Socioeconomic History  . Marital  status: Legally Separated    Spouse name: Not on file  . Number of children: 2  . Years of education: 85  . Highest education level: Not on file  Occupational History  . Occupation: unemployed  Social Needs  . Financial resource strain: Not on file  . Food insecurity:    Worry: Not on file    Inability: Not on file  . Transportation needs:    Medical: Not on file    Non-medical: Not on file  Tobacco Use  . Smoking status: Never Smoker  . Smokeless tobacco: Never Used  Substance and Sexual Activity  . Alcohol use: No    Alcohol/week: 0.0 standard drinks  . Drug use: No  . Sexual activity: Never    Birth control/protection: Post-menopausal  Lifestyle  . Physical activity:    Days per week: Not on file    Minutes per session: Not on file  . Stress: Not on file  Relationships  . Social connections:    Talks on phone: Not on file    Gets together: Not on file    Attends religious service: Not on file    Active member of club or organization: Not on file    Attends meetings of clubs or organizations: Not on file    Relationship status: Not on file  . Intimate partner violence:    Fear of current or ex  partner: Not on file    Emotionally abused: Not on file    Physically abused: Not on file    Forced sexual activity: Not on file  Other Topics Concern  . Not on file  Social History Narrative   Patient lives at home with her boyfriend.   Caffeine Use: couple of servings of tea daily   Patient have 2 children.    Patient has a high school education.    Patient is not currently working.   Patient is right handed.        PHYSICAL EXAM  Vitals:   02/21/18 1259  BP: (!) 143/85  Pulse: 68  Weight: 228 lb 9.6 oz (103.7 kg)   Body mass index is 46.17 kg/m.  Generalized: Well developed, obese  Elderly caucasian lady in no acute distress  Head: normocephalic and atraumatic,.   Neck: Supple, Musculoskeletal: No deformity   Neurological examination   Mentation: Alert oriented to time, place, history taking. Attention span and concentration appropriate. Recent and remote memory intact.  Follows all commands speech and language fluent.   Cranial nerve II-XII: Pupils were equal round reactive to light extraocular movements were full, visual field were full on confrontational test. Facial sensation and strength were normal. hearing was intact to finger rubbing bilaterally. Uvula tongue midline. head turning and shoulder shrug were normal and symmetric.Tongue protrusion into cheek strength was normal. Motor: normal bulk and tone, full strength in the BUE, BLE,  Sensory: normal and symmetric to light touch, in the upper and lower extremities  Coordination: finger-nose-finger, heel-to-shin bilaterally, no dysmetria, no tremor Reflexes: Symmetric upper and lower plantar responses were flexor bilaterally. Gait and Station: Rising up from seated position without assistance, stooped posture gait is mildly unsteady, tandem gait not attempted.   DIAGNOSTIC DATA (LABS, IMAGING, TESTING) - I reviewed patient records, labs, notes, testing and imaging myself where available.  Lab Results  Component  Value Date   WBC 7.2 07/15/2015   HGB 14.0 07/15/2015   HCT 42.3 07/15/2015   MCV 96 07/15/2015   PLT 206 07/15/2015  Component Value Date/Time   NA 142 07/15/2015 1029   K 4.4 07/15/2015 1029   CL 101 07/15/2015 1029   CO2 25 07/15/2015 1029   GLUCOSE 114 (H) 07/15/2015 1029   GLUCOSE 127 (H) 06/02/2013 0612   BUN 20 07/15/2015 1029   CREATININE 1.08 (H) 07/15/2015 1029   CALCIUM 9.0 07/15/2015 1029   PROT 6.9 07/15/2015 1029   ALBUMIN 3.8 07/15/2015 1029   AST 12 07/15/2015 1029   ALT 12 07/15/2015 1029   ALKPHOS 128 (H) 07/15/2015 1029   BILITOT 0.3 07/15/2015 1029   GFRNONAA 53 (L) 07/15/2015 1029   GFRAA 61 07/15/2015 1029    ASSESSMENT AND PLAN  72 y.o. year old female  has a past medical history of COPD (chronic obstructive pulmonary disease) ([place]); Depression; Diabetes mellitus without complication ([place]); Hypertension; Neuralgia; Osteoarthritis; and Shortness of breath. Here to follow-up for her trigeminal neuralgia left face.The patient is a current patient of Dr. Leonie Man  who is out of the office today . This note is sent to the work in doctor.     PLAN: I had a long discussion with the patient regarding her trigeminal neuralgic pain which appears to be quite stable and well controlled on the current medication regimen of Lyrica 150 mg 3 times daily and gabapentin 600 mg 3 times daily. Continue present regimen. She will return for follow-up in a year with Hoyle Sauer  Martin,practitioner call earlier if necessary I spent 20 min in total face to face time with the patient more than 50% of which was spent counseling and coordination of care, reviewing test results reviewing medications and discussing and reviewing the diagnosis of left trigeminal neuralgia and further treatment options.    Antony Contras, MD [facility] [address] [place], [place] [address] [phone]

## 2018-02-21 NOTE — Patient Instructions (Signed)
I had a long discussion with the patient regarding her trigeminal neuralgic pain which appears to be quite stable and well controlled on the current medication regimen of Lyrica 150 mg 3 times daily and gabapentin 600 mg 3 times daily. Continue present regimen. She will return for follow-up in a year with Hoyle Sauer  Martin,practitioner call earlier if necessary

## 2018-03-06 DIAGNOSIS — E1122 Type 2 diabetes mellitus with diabetic chronic kidney disease: Secondary | ICD-10-CM | POA: Diagnosis not present

## 2018-03-06 DIAGNOSIS — G500 Trigeminal neuralgia: Secondary | ICD-10-CM | POA: Diagnosis not present

## 2018-03-06 DIAGNOSIS — E782 Mixed hyperlipidemia: Secondary | ICD-10-CM | POA: Diagnosis not present

## 2018-04-05 DIAGNOSIS — I10 Essential (primary) hypertension: Secondary | ICD-10-CM | POA: Diagnosis not present

## 2018-04-05 DIAGNOSIS — E1122 Type 2 diabetes mellitus with diabetic chronic kidney disease: Secondary | ICD-10-CM | POA: Diagnosis not present

## 2018-04-05 DIAGNOSIS — E782 Mixed hyperlipidemia: Secondary | ICD-10-CM | POA: Diagnosis not present

## 2018-04-05 DIAGNOSIS — Z79891 Long term (current) use of opiate analgesic: Secondary | ICD-10-CM | POA: Diagnosis not present

## 2018-04-09 DIAGNOSIS — E782 Mixed hyperlipidemia: Secondary | ICD-10-CM | POA: Diagnosis not present

## 2018-04-09 DIAGNOSIS — G252 Other specified forms of tremor: Secondary | ICD-10-CM | POA: Diagnosis not present

## 2018-04-09 DIAGNOSIS — I10 Essential (primary) hypertension: Secondary | ICD-10-CM | POA: Diagnosis not present

## 2018-04-09 DIAGNOSIS — Z0001 Encounter for general adult medical examination with abnormal findings: Secondary | ICD-10-CM | POA: Diagnosis not present

## 2018-04-09 DIAGNOSIS — Z23 Encounter for immunization: Secondary | ICD-10-CM | POA: Diagnosis not present

## 2018-06-08 DIAGNOSIS — I10 Essential (primary) hypertension: Secondary | ICD-10-CM | POA: Diagnosis not present

## 2018-06-08 DIAGNOSIS — E1122 Type 2 diabetes mellitus with diabetic chronic kidney disease: Secondary | ICD-10-CM | POA: Diagnosis not present

## 2018-06-08 DIAGNOSIS — E1165 Type 2 diabetes mellitus with hyperglycemia: Secondary | ICD-10-CM | POA: Diagnosis not present

## 2018-06-11 ENCOUNTER — Ambulatory Visit: Payer: Medicare Other | Admitting: Nurse Practitioner

## 2018-07-08 DIAGNOSIS — G500 Trigeminal neuralgia: Secondary | ICD-10-CM | POA: Diagnosis not present

## 2018-07-08 DIAGNOSIS — I10 Essential (primary) hypertension: Secondary | ICD-10-CM | POA: Diagnosis not present

## 2018-07-08 DIAGNOSIS — E1165 Type 2 diabetes mellitus with hyperglycemia: Secondary | ICD-10-CM | POA: Diagnosis not present

## 2018-07-16 DIAGNOSIS — J9611 Chronic respiratory failure with hypoxia: Secondary | ICD-10-CM | POA: Diagnosis not present

## 2018-07-16 DIAGNOSIS — Z9189 Other specified personal risk factors, not elsewhere classified: Secondary | ICD-10-CM | POA: Diagnosis not present

## 2018-07-16 DIAGNOSIS — E1165 Type 2 diabetes mellitus with hyperglycemia: Secondary | ICD-10-CM | POA: Diagnosis not present

## 2018-07-16 DIAGNOSIS — N184 Chronic kidney disease, stage 4 (severe): Secondary | ICD-10-CM | POA: Diagnosis not present

## 2018-07-16 DIAGNOSIS — E782 Mixed hyperlipidemia: Secondary | ICD-10-CM | POA: Diagnosis not present

## 2018-07-19 DIAGNOSIS — E782 Mixed hyperlipidemia: Secondary | ICD-10-CM | POA: Diagnosis not present

## 2018-07-19 DIAGNOSIS — I10 Essential (primary) hypertension: Secondary | ICD-10-CM | POA: Diagnosis not present

## 2018-07-19 DIAGNOSIS — E1122 Type 2 diabetes mellitus with diabetic chronic kidney disease: Secondary | ICD-10-CM | POA: Diagnosis not present

## 2018-07-19 DIAGNOSIS — G500 Trigeminal neuralgia: Secondary | ICD-10-CM | POA: Diagnosis not present

## 2018-09-06 DIAGNOSIS — I10 Essential (primary) hypertension: Secondary | ICD-10-CM | POA: Diagnosis not present

## 2018-09-18 ENCOUNTER — Other Ambulatory Visit: Payer: Self-pay | Admitting: Neurology

## 2018-10-01 ENCOUNTER — Telehealth: Payer: Self-pay | Admitting: Neurology

## 2018-10-01 NOTE — Telephone Encounter (Signed)
[facility] is requesting new script for pregabalin (LYRICA) 150 MG capsule and gabapentin (NEURONTIN) 300 MG capsule. (p) [phone]

## 2018-10-02 ENCOUNTER — Other Ambulatory Visit: Payer: Self-pay

## 2018-10-02 MED ORDER — GABAPENTIN 300 MG PO CAPS: 600.0000 mg | ORAL_CAPSULE | Freq: Three times a day (TID) | ORAL | 6 refills | Status: DC

## 2018-10-02 MED ORDER — PREGABALIN 150 MG PO CAPS: 150.0000 mg | ORAL_CAPSULE | Freq: Three times a day (TID) | ORAL | 6 refills | Status: DC

## 2018-10-02 NOTE — Telephone Encounter (Signed)
I called [facility]. New rx sent for both meds. Sent via epic and fax. [facility] verbalized understanding.Lyrica was fax and gabapentin was sent via epic.

## 2018-10-07 DIAGNOSIS — G500 Trigeminal neuralgia: Secondary | ICD-10-CM | POA: Diagnosis not present

## 2018-10-07 DIAGNOSIS — E1122 Type 2 diabetes mellitus with diabetic chronic kidney disease: Secondary | ICD-10-CM | POA: Diagnosis not present

## 2018-10-07 DIAGNOSIS — E782 Mixed hyperlipidemia: Secondary | ICD-10-CM | POA: Diagnosis not present

## 2018-10-28 DIAGNOSIS — I10 Essential (primary) hypertension: Secondary | ICD-10-CM | POA: Diagnosis not present

## 2018-10-28 DIAGNOSIS — E1165 Type 2 diabetes mellitus with hyperglycemia: Secondary | ICD-10-CM | POA: Diagnosis not present

## 2018-10-28 DIAGNOSIS — E782 Mixed hyperlipidemia: Secondary | ICD-10-CM | POA: Diagnosis not present

## 2018-10-28 DIAGNOSIS — E1122 Type 2 diabetes mellitus with diabetic chronic kidney disease: Secondary | ICD-10-CM | POA: Diagnosis not present

## 2018-10-28 DIAGNOSIS — N184 Chronic kidney disease, stage 4 (severe): Secondary | ICD-10-CM | POA: Diagnosis not present

## 2018-10-30 DIAGNOSIS — I10 Essential (primary) hypertension: Secondary | ICD-10-CM | POA: Diagnosis not present

## 2018-10-30 DIAGNOSIS — E1122 Type 2 diabetes mellitus with diabetic chronic kidney disease: Secondary | ICD-10-CM | POA: Diagnosis not present

## 2018-10-30 DIAGNOSIS — G500 Trigeminal neuralgia: Secondary | ICD-10-CM | POA: Diagnosis not present

## 2018-10-30 DIAGNOSIS — E782 Mixed hyperlipidemia: Secondary | ICD-10-CM | POA: Diagnosis not present

## 2018-12-07 DIAGNOSIS — E1122 Type 2 diabetes mellitus with diabetic chronic kidney disease: Secondary | ICD-10-CM | POA: Diagnosis not present

## 2018-12-07 DIAGNOSIS — E782 Mixed hyperlipidemia: Secondary | ICD-10-CM | POA: Diagnosis not present

## 2019-01-07 DIAGNOSIS — E782 Mixed hyperlipidemia: Secondary | ICD-10-CM | POA: Diagnosis not present

## 2019-01-07 DIAGNOSIS — I10 Essential (primary) hypertension: Secondary | ICD-10-CM | POA: Diagnosis not present

## 2019-01-07 DIAGNOSIS — E785 Hyperlipidemia, unspecified: Secondary | ICD-10-CM | POA: Diagnosis not present

## 2019-01-07 DIAGNOSIS — E1165 Type 2 diabetes mellitus with hyperglycemia: Secondary | ICD-10-CM | POA: Diagnosis not present

## 2019-01-22 DIAGNOSIS — N184 Chronic kidney disease, stage 4 (severe): Secondary | ICD-10-CM | POA: Diagnosis not present

## 2019-01-22 DIAGNOSIS — E1165 Type 2 diabetes mellitus with hyperglycemia: Secondary | ICD-10-CM | POA: Diagnosis not present

## 2019-01-22 DIAGNOSIS — E039 Hypothyroidism, unspecified: Secondary | ICD-10-CM | POA: Diagnosis not present

## 2019-01-22 DIAGNOSIS — I10 Essential (primary) hypertension: Secondary | ICD-10-CM | POA: Diagnosis not present

## 2019-01-22 DIAGNOSIS — E1122 Type 2 diabetes mellitus with diabetic chronic kidney disease: Secondary | ICD-10-CM | POA: Diagnosis not present

## 2019-01-22 DIAGNOSIS — E782 Mixed hyperlipidemia: Secondary | ICD-10-CM | POA: Diagnosis not present

## 2019-01-27 DIAGNOSIS — G500 Trigeminal neuralgia: Secondary | ICD-10-CM | POA: Diagnosis not present

## 2019-01-27 DIAGNOSIS — G252 Other specified forms of tremor: Secondary | ICD-10-CM | POA: Diagnosis not present

## 2019-01-27 DIAGNOSIS — Z1212 Encounter for screening for malignant neoplasm of rectum: Secondary | ICD-10-CM | POA: Diagnosis not present

## 2019-01-27 DIAGNOSIS — Z0001 Encounter for general adult medical examination with abnormal findings: Secondary | ICD-10-CM | POA: Diagnosis not present

## 2019-01-27 DIAGNOSIS — I10 Essential (primary) hypertension: Secondary | ICD-10-CM | POA: Diagnosis not present

## 2019-01-27 DIAGNOSIS — E1122 Type 2 diabetes mellitus with diabetic chronic kidney disease: Secondary | ICD-10-CM | POA: Diagnosis not present

## 2019-02-07 DIAGNOSIS — I10 Essential (primary) hypertension: Secondary | ICD-10-CM | POA: Diagnosis not present

## 2019-02-07 DIAGNOSIS — E782 Mixed hyperlipidemia: Secondary | ICD-10-CM | POA: Diagnosis not present

## 2019-02-07 DIAGNOSIS — E1165 Type 2 diabetes mellitus with hyperglycemia: Secondary | ICD-10-CM | POA: Diagnosis not present

## 2019-02-10 ENCOUNTER — Other Ambulatory Visit: Payer: Self-pay | Admitting: Neurology

## 2019-02-27 ENCOUNTER — Ambulatory Visit (INDEPENDENT_AMBULATORY_CARE_PROVIDER_SITE_OTHER): Payer: Medicare Other | Admitting: Adult Health

## 2019-02-27 ENCOUNTER — Other Ambulatory Visit: Payer: Self-pay

## 2019-02-27 ENCOUNTER — Encounter: Payer: Self-pay | Admitting: Adult Health

## 2019-02-27 VITALS — BP 167/83 | HR 70 | Temp 98.3°F | Ht 59.0 in | Wt 230.0 lb

## 2019-02-27 DIAGNOSIS — G500 Trigeminal neuralgia: Secondary | ICD-10-CM

## 2019-02-27 NOTE — Progress Notes (Signed)
I agree with the above plan 

## 2019-02-27 NOTE — Progress Notes (Signed)
PATIENT: Kayla Payne DOB: 1945/10/12  REASON FOR VISIT: follow up HISTORY FROM: patient  HISTORY OF PRESENT ILLNESS: Today 02/27/19:  Kayla Payne is a 73 year old female with a history of trigeminal neuralgia.  She returns today for follow-up.  She is currently on Lyrica 150 mg 3 times a day as well as gabapentin 600 mg 3 times a day.  She reports that this medication has controlled her trigeminal neuralgia.  She states that she may have a mild flareup every 5 to 6 months.  She states that she is tolerating these medications well.  She denies any excessive drowsiness, gait difficulty or cognitive issues.  She denies any new issues.  She returns today for follow-up.  HISTORY 02/21/2018 ; she returns for follow-up after last visit with Gilford Raid, nurse practitioner 4 months ago. She has tapered and discontinued carbamazepine 3 months ago and has not noticed any significant worsening of her trigeminal neuralgic pain which still occurs intermittently about once a week or so. She does take Lyrica 150 mg 3 times daily and gabapentin 600 mg 3 times daily which seemed to work quite well. She is tolerating both medications without any side effects. She had, well-appearing level checked on 10/10/2017 prior to discontinuing it and was low at 2.6. Patient said she was referred to neurosurgeon for surgical treatment of carpal trigeminal neuralgia in the past but since her symptoms spontaneously improved receive chose not to undergo surgery. She had recent changes in her blood pressure medication Norvasc was discontinued and replaced with lisinopril and blood pressure is doing better and today it is 143/85. She has no new complaints.  REVIEW OF SYSTEMS: Out of a complete 14 system review of symptoms, the patient complains only of the following symptoms, and all other reviewed systems are negative.  Numbness, weakness, tremors, agitation, depression, nervous/anxious  ALLERGIES: Allergies   Allergen Reactions  . Morphine Swelling  . Morphine And Related Other (See Comments) and Rash    HOME MEDICATIONS: Outpatient Medications Prior to Visit  Medication Sig Dispense Refill  . amLODipine (NORVASC) 10 MG tablet Take 10 mg by mouth daily.     Marland Kitchen aspirin EC 81 MG tablet Take 81 mg by mouth daily.    . Black Cohosh 40 MG CAPS Take by mouth daily.    Marland Kitchen buPROPion (WELLBUTRIN XL) 300 MG 24 hr tablet Take 300 mg by mouth daily.     Marland Kitchen CINNAMON PO Take 2 tablets by mouth daily.    Marland Kitchen FLUoxetine (PROZAC) 40 MG capsule Take 40 mg by mouth at bedtime.    . gabapentin (NEURONTIN) 300 MG capsule Take 2 capsules (600 mg total) by mouth 3 (three) times daily. 180 capsule 6  . HYDROcodone-acetaminophen (NORCO) 10-325 MG tablet Take by mouth.    Marland Kitchen lisinopril (PRINIVIL,ZESTRIL) 10 MG tablet Take 10 mg by mouth daily.    . pregabalin (LYRICA) 150 MG capsule TAKE 1 CAPSULE BY MOUTH THREE TIMES DAILY 90 capsule 4  . ACCU-CHEK SOFTCLIX LANCETS lancets     . cycloSPORINE (RESTASIS) 0.05 % ophthalmic emulsion Place 1 drop into both eyes 2 (two) times daily as needed.      No facility-administered medications prior to visit.     PAST MEDICAL HISTORY: Past Medical History:  Diagnosis Date  . COPD (chronic obstructive pulmonary disease) ([place])   . Depression   . Hypertension   . Neuralgia    trimenial  . Osteoarthritis   . Shortness of breath  PAST SURGICAL HISTORY: Past Surgical History:  Procedure Laterality Date  . CATARACT EXTRACTION W/PHACO  05/16/2012   Procedure: CATARACT EXTRACTION PHACO AND INTRAOCULAR LENS PLACEMENT (IOC);  Surgeon: Tonny Branch, MD;  Location: [facility];  Service: Ophthalmology;  Laterality: Left;  CDE 9.31  . CATARACT EXTRACTION W/PHACO Right 08/19/2012   Procedure: CATARACT EXTRACTION PHACO AND INTRAOCULAR LENS PLACEMENT (IOC);  Surgeon: Tonny Branch, MD;  Location: [facility];  Service: Ophthalmology;  Laterality: Right;  CDE: 9.46  . CESAREAN SECTION     x2  .  CHOLECYSTECTOMY  2000   [facility]  . EXPLORATION MIDDLE EAR     hearing loss- with surgery. subsequently found skull fracture as reason for hearing loss.  . FRACTURE SURGERY     skull fracture repair-behind left ear  . HERNIA REPAIR  2000, 10/28/13   right and left inguinal-[facility]    FAMILY HISTORY: Family History  Problem Relation Age of Onset  . Hyperlipidemia Mother   . Hypertension Mother   . Diabetes Mother   . Hyperlipidemia Father   . Hypertension Father     SOCIAL HISTORY: Social History   Socioeconomic History  . Marital status: Legally Separated    Spouse name: Not on file  . Number of children: 2  . Years of education: 57  . Highest education level: Not on file  Occupational History  . Occupation: unemployed  Social Needs  . Financial resource strain: Not on file  . Food insecurity    Worry: Not on file    Inability: Not on file  . Transportation needs    Medical: Not on file    Non-medical: Not on file  Tobacco Use  . Smoking status: Never Smoker  . Smokeless tobacco: Never Used  Substance and Sexual Activity  . Alcohol use: No    Alcohol/week: 0.0 standard drinks  . Drug use: No  . Sexual activity: Never    Birth control/protection: Post-menopausal  Lifestyle  . Physical activity    Days per week: Not on file    Minutes per session: Not on file  . Stress: Not on file  Relationships  . Social Herbalist on phone: Not on file    Gets together: Not on file    Attends religious service: Not on file    Active member of club or organization: Not on file    Attends meetings of clubs or organizations: Not on file    Relationship status: Not on file  . Intimate partner violence    Fear of current or ex partner: Not on file    Emotionally abused: Not on file    Physically abused: Not on file    Forced sexual activity: Not on file  Other Topics Concern  . Not on file  Social History Narrative   Patient lives at home with her boyfriend.    Caffeine Use: couple of servings of tea daily   Patient have 2 children.    Patient has a high school education.    Patient is not currently working.   Patient is right handed.         PHYSICAL EXAM  Vitals:   02/27/19 1248  BP: (!) 167/83  Pulse: 70  Temp: 98.3 F (36.8 C)  Weight: 230 lb (104.3 kg)  Height: 4\' 11"  (1.499 m)   Body mass index is 46.45 kg/m.  Generalized: Well developed, in no acute distress   Neurological examination  Mentation: Alert oriented to time, place, history  taking. Follows all commands speech and language fluent Cranial nerve II-XII: Pupils were equal round reactive to light. Extraocular movements were full, visual field were full on confrontational test. Facial sensation and strength were normal.  Head turning and shoulder shrug  were normal and symmetric. Motor: The motor testing reveals 5 over 5 strength of all 4 extremities. Good symmetric motor tone is noted throughout.  Sensory: Sensory testing is intact to soft touch on all 4 extremities. No evidence of extinction is noted.  Coordination: Cerebellar testing reveals good finger-nose-finger and heel-to-shin bilaterally.  Gait and station: Patient uses a cane when ambulating.   DIAGNOSTIC DATA (LABS, IMAGING, TESTING) - I reviewed patient records, labs, notes, testing and imaging myself where available.  Lab Results  Component Value Date   WBC 7.2 07/15/2015   HGB 14.0 07/15/2015   HCT 42.3 07/15/2015   MCV 96 07/15/2015   PLT 206 07/15/2015      Component Value Date/Time   NA 142 07/15/2015 1029   K 4.4 07/15/2015 1029   CL 101 07/15/2015 1029   CO2 25 07/15/2015 1029   GLUCOSE 114 (H) 07/15/2015 1029   GLUCOSE 127 (H) 06/02/2013 0612   BUN 20 07/15/2015 1029   CREATININE 1.08 (H) 07/15/2015 1029   CALCIUM 9.0 07/15/2015 1029   PROT 6.9 07/15/2015 1029   ALBUMIN 3.8 07/15/2015 1029   AST 12 07/15/2015 1029   ALT 12 07/15/2015 1029   ALKPHOS 128 (H) 07/15/2015 1029    BILITOT 0.3 07/15/2015 1029   GFRNONAA 53 (L) 07/15/2015 1029   GFRAA 61 07/15/2015 1029     ASSESSMENT AND PLAN 73 y.o. year old female  has a past medical history of COPD (chronic obstructive pulmonary disease) ([place]), Depression, Hypertension, Neuralgia, Osteoarthritis, and Shortness of breath. here with:  1.  Trigeminal neuralgia  Overall the patient is doing well.  She will continue on gabapentin 600 mg 3 times a day and Lyrica 150 mg 3 times a day.  She is advised that if her symptoms worsen or she develops new symptoms she should let us know.  We also discussed her primary care taking over these prescriptions if they were amendable.  If not she will continue to follow with our office yearly or sooner if needed.   I spent 15 minutes with the patient. 50% of this time was spent discussing her medication regimen and plan of care   Ward Givens, MSN, NP-C 02/27/2019, 1:31 PM [facility] [address] [place], [place] [address] [phone]

## 2019-02-27 NOTE — Patient Instructions (Signed)
Your Plan:  Continue Lyrica and Gabapentin If your symptoms worsen or you develop new symptoms please let us know.   Thank you for coming to see [place] at [facility]. I hope we have been able to provide you high quality care today.  You may receive a patient satisfaction survey over the next few weeks. We would appreciate your feedback and comments so that we may continue to improve ourselves and the health of our patients.

## 2019-03-10 DIAGNOSIS — E782 Mixed hyperlipidemia: Secondary | ICD-10-CM | POA: Diagnosis not present

## 2019-03-10 DIAGNOSIS — I10 Essential (primary) hypertension: Secondary | ICD-10-CM | POA: Diagnosis not present

## 2019-04-09 DIAGNOSIS — I10 Essential (primary) hypertension: Secondary | ICD-10-CM | POA: Diagnosis not present

## 2019-04-09 DIAGNOSIS — E785 Hyperlipidemia, unspecified: Secondary | ICD-10-CM | POA: Diagnosis not present

## 2019-05-09 DIAGNOSIS — E782 Mixed hyperlipidemia: Secondary | ICD-10-CM | POA: Diagnosis not present

## 2019-05-09 DIAGNOSIS — I10 Essential (primary) hypertension: Secondary | ICD-10-CM | POA: Diagnosis not present

## 2019-05-14 DIAGNOSIS — R262 Difficulty in walking, not elsewhere classified: Secondary | ICD-10-CM | POA: Diagnosis not present

## 2019-05-14 DIAGNOSIS — M6281 Muscle weakness (generalized): Secondary | ICD-10-CM | POA: Diagnosis not present

## 2019-06-09 DIAGNOSIS — E782 Mixed hyperlipidemia: Secondary | ICD-10-CM | POA: Diagnosis not present

## 2019-06-09 DIAGNOSIS — I10 Essential (primary) hypertension: Secondary | ICD-10-CM | POA: Diagnosis not present

## 2019-06-19 DIAGNOSIS — E782 Mixed hyperlipidemia: Secondary | ICD-10-CM | POA: Diagnosis not present

## 2019-06-19 DIAGNOSIS — I10 Essential (primary) hypertension: Secondary | ICD-10-CM | POA: Diagnosis not present

## 2019-06-19 DIAGNOSIS — E039 Hypothyroidism, unspecified: Secondary | ICD-10-CM | POA: Diagnosis not present

## 2019-06-19 DIAGNOSIS — N184 Chronic kidney disease, stage 4 (severe): Secondary | ICD-10-CM | POA: Diagnosis not present

## 2019-06-19 DIAGNOSIS — E1122 Type 2 diabetes mellitus with diabetic chronic kidney disease: Secondary | ICD-10-CM | POA: Diagnosis not present

## 2019-06-24 DIAGNOSIS — Z79891 Long term (current) use of opiate analgesic: Secondary | ICD-10-CM | POA: Diagnosis not present

## 2019-06-24 DIAGNOSIS — G252 Other specified forms of tremor: Secondary | ICD-10-CM | POA: Diagnosis not present

## 2019-06-24 DIAGNOSIS — Z23 Encounter for immunization: Secondary | ICD-10-CM | POA: Diagnosis not present

## 2019-06-24 DIAGNOSIS — I10 Essential (primary) hypertension: Secondary | ICD-10-CM | POA: Diagnosis not present

## 2019-06-24 DIAGNOSIS — E1122 Type 2 diabetes mellitus with diabetic chronic kidney disease: Secondary | ICD-10-CM | POA: Diagnosis not present

## 2019-06-24 DIAGNOSIS — G500 Trigeminal neuralgia: Secondary | ICD-10-CM | POA: Diagnosis not present

## 2019-07-10 DIAGNOSIS — E782 Mixed hyperlipidemia: Secondary | ICD-10-CM | POA: Diagnosis not present

## 2019-07-10 DIAGNOSIS — I10 Essential (primary) hypertension: Secondary | ICD-10-CM | POA: Diagnosis not present

## 2019-09-16 DIAGNOSIS — I10 Essential (primary) hypertension: Secondary | ICD-10-CM | POA: Diagnosis not present

## 2019-09-16 DIAGNOSIS — E782 Mixed hyperlipidemia: Secondary | ICD-10-CM | POA: Diagnosis not present

## 2019-09-16 DIAGNOSIS — E1122 Type 2 diabetes mellitus with diabetic chronic kidney disease: Secondary | ICD-10-CM | POA: Diagnosis not present

## 2019-09-16 DIAGNOSIS — R946 Abnormal results of thyroid function studies: Secondary | ICD-10-CM | POA: Diagnosis not present

## 2019-09-19 DIAGNOSIS — R4582 Worries: Secondary | ICD-10-CM | POA: Diagnosis not present

## 2019-09-19 DIAGNOSIS — Z79891 Long term (current) use of opiate analgesic: Secondary | ICD-10-CM | POA: Diagnosis not present

## 2019-09-19 DIAGNOSIS — G500 Trigeminal neuralgia: Secondary | ICD-10-CM | POA: Diagnosis not present

## 2019-09-19 DIAGNOSIS — I10 Essential (primary) hypertension: Secondary | ICD-10-CM | POA: Diagnosis not present

## 2019-09-19 DIAGNOSIS — E1122 Type 2 diabetes mellitus with diabetic chronic kidney disease: Secondary | ICD-10-CM | POA: Diagnosis not present

## 2019-10-08 DIAGNOSIS — I10 Essential (primary) hypertension: Secondary | ICD-10-CM | POA: Diagnosis not present

## 2019-10-08 DIAGNOSIS — E1165 Type 2 diabetes mellitus with hyperglycemia: Secondary | ICD-10-CM | POA: Diagnosis not present

## 2019-10-08 DIAGNOSIS — E7849 Other hyperlipidemia: Secondary | ICD-10-CM | POA: Diagnosis not present

## 2019-11-07 DIAGNOSIS — E7849 Other hyperlipidemia: Secondary | ICD-10-CM | POA: Diagnosis not present

## 2019-11-07 DIAGNOSIS — I10 Essential (primary) hypertension: Secondary | ICD-10-CM | POA: Diagnosis not present

## 2019-12-08 DIAGNOSIS — E1122 Type 2 diabetes mellitus with diabetic chronic kidney disease: Secondary | ICD-10-CM | POA: Diagnosis not present

## 2019-12-08 DIAGNOSIS — I129 Hypertensive chronic kidney disease with stage 1 through stage 4 chronic kidney disease, or unspecified chronic kidney disease: Secondary | ICD-10-CM | POA: Diagnosis not present

## 2019-12-08 DIAGNOSIS — E7849 Other hyperlipidemia: Secondary | ICD-10-CM | POA: Diagnosis not present

## 2019-12-08 DIAGNOSIS — N184 Chronic kidney disease, stage 4 (severe): Secondary | ICD-10-CM | POA: Diagnosis not present

## 2020-01-07 DIAGNOSIS — E1122 Type 2 diabetes mellitus with diabetic chronic kidney disease: Secondary | ICD-10-CM | POA: Diagnosis not present

## 2020-01-07 DIAGNOSIS — E7849 Other hyperlipidemia: Secondary | ICD-10-CM | POA: Diagnosis not present

## 2020-01-07 DIAGNOSIS — Z79891 Long term (current) use of opiate analgesic: Secondary | ICD-10-CM | POA: Diagnosis not present

## 2020-01-07 DIAGNOSIS — N184 Chronic kidney disease, stage 4 (severe): Secondary | ICD-10-CM | POA: Diagnosis not present

## 2020-01-07 DIAGNOSIS — I10 Essential (primary) hypertension: Secondary | ICD-10-CM | POA: Diagnosis not present

## 2020-01-07 DIAGNOSIS — E039 Hypothyroidism, unspecified: Secondary | ICD-10-CM | POA: Diagnosis not present

## 2020-01-07 DIAGNOSIS — I129 Hypertensive chronic kidney disease with stage 1 through stage 4 chronic kidney disease, or unspecified chronic kidney disease: Secondary | ICD-10-CM | POA: Diagnosis not present

## 2020-01-07 DIAGNOSIS — Z0001 Encounter for general adult medical examination with abnormal findings: Secondary | ICD-10-CM | POA: Diagnosis not present

## 2020-01-07 DIAGNOSIS — E782 Mixed hyperlipidemia: Secondary | ICD-10-CM | POA: Diagnosis not present

## 2020-01-08 DIAGNOSIS — H5213 Myopia, bilateral: Secondary | ICD-10-CM | POA: Diagnosis not present

## 2020-01-12 DIAGNOSIS — R4582 Worries: Secondary | ICD-10-CM | POA: Diagnosis not present

## 2020-01-12 DIAGNOSIS — E782 Mixed hyperlipidemia: Secondary | ICD-10-CM | POA: Diagnosis not present

## 2020-01-12 DIAGNOSIS — G500 Trigeminal neuralgia: Secondary | ICD-10-CM | POA: Diagnosis not present

## 2020-01-12 DIAGNOSIS — Z0001 Encounter for general adult medical examination with abnormal findings: Secondary | ICD-10-CM | POA: Diagnosis not present

## 2020-01-12 DIAGNOSIS — I10 Essential (primary) hypertension: Secondary | ICD-10-CM | POA: Diagnosis not present

## 2020-01-12 DIAGNOSIS — E1122 Type 2 diabetes mellitus with diabetic chronic kidney disease: Secondary | ICD-10-CM | POA: Diagnosis not present

## 2020-01-12 DIAGNOSIS — Z79891 Long term (current) use of opiate analgesic: Secondary | ICD-10-CM | POA: Diagnosis not present

## 2020-01-16 DIAGNOSIS — I10 Essential (primary) hypertension: Secondary | ICD-10-CM | POA: Diagnosis not present

## 2020-01-16 DIAGNOSIS — Z0001 Encounter for general adult medical examination with abnormal findings: Secondary | ICD-10-CM | POA: Diagnosis not present

## 2020-01-16 DIAGNOSIS — E782 Mixed hyperlipidemia: Secondary | ICD-10-CM | POA: Diagnosis not present

## 2020-01-16 DIAGNOSIS — E1140 Type 2 diabetes mellitus with diabetic neuropathy, unspecified: Secondary | ICD-10-CM | POA: Diagnosis not present

## 2020-01-19 DIAGNOSIS — Z23 Encounter for immunization: Secondary | ICD-10-CM | POA: Diagnosis not present

## 2020-02-04 DIAGNOSIS — H524 Presbyopia: Secondary | ICD-10-CM | POA: Diagnosis not present

## 2020-02-04 DIAGNOSIS — H52223 Regular astigmatism, bilateral: Secondary | ICD-10-CM | POA: Diagnosis not present

## 2020-02-06 DIAGNOSIS — E7849 Other hyperlipidemia: Secondary | ICD-10-CM | POA: Diagnosis not present

## 2020-02-06 DIAGNOSIS — I129 Hypertensive chronic kidney disease with stage 1 through stage 4 chronic kidney disease, or unspecified chronic kidney disease: Secondary | ICD-10-CM | POA: Diagnosis not present

## 2020-02-06 DIAGNOSIS — E1122 Type 2 diabetes mellitus with diabetic chronic kidney disease: Secondary | ICD-10-CM | POA: Diagnosis not present

## 2020-02-06 DIAGNOSIS — N184 Chronic kidney disease, stage 4 (severe): Secondary | ICD-10-CM | POA: Diagnosis not present

## 2020-03-02 ENCOUNTER — Ambulatory Visit: Payer: Medicare Other | Admitting: Adult Health

## 2020-03-04 DIAGNOSIS — Z23 Encounter for immunization: Secondary | ICD-10-CM | POA: Diagnosis not present

## 2020-03-09 DIAGNOSIS — N184 Chronic kidney disease, stage 4 (severe): Secondary | ICD-10-CM | POA: Diagnosis not present

## 2020-03-09 DIAGNOSIS — I129 Hypertensive chronic kidney disease with stage 1 through stage 4 chronic kidney disease, or unspecified chronic kidney disease: Secondary | ICD-10-CM | POA: Diagnosis not present

## 2020-03-09 DIAGNOSIS — E1122 Type 2 diabetes mellitus with diabetic chronic kidney disease: Secondary | ICD-10-CM | POA: Diagnosis not present

## 2020-03-09 DIAGNOSIS — E7849 Other hyperlipidemia: Secondary | ICD-10-CM | POA: Diagnosis not present

## 2020-04-08 DIAGNOSIS — E7849 Other hyperlipidemia: Secondary | ICD-10-CM | POA: Diagnosis not present

## 2020-04-08 DIAGNOSIS — I129 Hypertensive chronic kidney disease with stage 1 through stage 4 chronic kidney disease, or unspecified chronic kidney disease: Secondary | ICD-10-CM | POA: Diagnosis not present

## 2020-04-08 DIAGNOSIS — E1122 Type 2 diabetes mellitus with diabetic chronic kidney disease: Secondary | ICD-10-CM | POA: Diagnosis not present

## 2020-04-08 DIAGNOSIS — N184 Chronic kidney disease, stage 4 (severe): Secondary | ICD-10-CM | POA: Diagnosis not present

## 2020-04-14 DIAGNOSIS — N184 Chronic kidney disease, stage 4 (severe): Secondary | ICD-10-CM | POA: Diagnosis not present

## 2020-04-14 DIAGNOSIS — E1122 Type 2 diabetes mellitus with diabetic chronic kidney disease: Secondary | ICD-10-CM | POA: Diagnosis not present

## 2020-04-14 DIAGNOSIS — I10 Essential (primary) hypertension: Secondary | ICD-10-CM | POA: Diagnosis not present

## 2020-04-14 DIAGNOSIS — Z79891 Long term (current) use of opiate analgesic: Secondary | ICD-10-CM | POA: Diagnosis not present

## 2020-04-14 DIAGNOSIS — E782 Mixed hyperlipidemia: Secondary | ICD-10-CM | POA: Diagnosis not present

## 2020-04-19 DIAGNOSIS — E782 Mixed hyperlipidemia: Secondary | ICD-10-CM | POA: Diagnosis not present

## 2020-04-19 DIAGNOSIS — Z79891 Long term (current) use of opiate analgesic: Secondary | ICD-10-CM | POA: Diagnosis not present

## 2020-04-19 DIAGNOSIS — Z23 Encounter for immunization: Secondary | ICD-10-CM | POA: Diagnosis not present

## 2020-04-19 DIAGNOSIS — I10 Essential (primary) hypertension: Secondary | ICD-10-CM | POA: Diagnosis not present

## 2020-04-19 DIAGNOSIS — E1122 Type 2 diabetes mellitus with diabetic chronic kidney disease: Secondary | ICD-10-CM | POA: Diagnosis not present

## 2020-04-19 DIAGNOSIS — G500 Trigeminal neuralgia: Secondary | ICD-10-CM | POA: Diagnosis not present

## 2020-05-08 DIAGNOSIS — E7849 Other hyperlipidemia: Secondary | ICD-10-CM | POA: Diagnosis not present

## 2020-05-08 DIAGNOSIS — E1122 Type 2 diabetes mellitus with diabetic chronic kidney disease: Secondary | ICD-10-CM | POA: Diagnosis not present

## 2020-05-08 DIAGNOSIS — N184 Chronic kidney disease, stage 4 (severe): Secondary | ICD-10-CM | POA: Diagnosis not present

## 2020-05-08 DIAGNOSIS — I129 Hypertensive chronic kidney disease with stage 1 through stage 4 chronic kidney disease, or unspecified chronic kidney disease: Secondary | ICD-10-CM | POA: Diagnosis not present

## 2020-06-08 DIAGNOSIS — N184 Chronic kidney disease, stage 4 (severe): Secondary | ICD-10-CM | POA: Diagnosis not present

## 2020-06-08 DIAGNOSIS — E1122 Type 2 diabetes mellitus with diabetic chronic kidney disease: Secondary | ICD-10-CM | POA: Diagnosis not present

## 2020-06-08 DIAGNOSIS — I129 Hypertensive chronic kidney disease with stage 1 through stage 4 chronic kidney disease, or unspecified chronic kidney disease: Secondary | ICD-10-CM | POA: Diagnosis not present

## 2020-07-09 DIAGNOSIS — I129 Hypertensive chronic kidney disease with stage 1 through stage 4 chronic kidney disease, or unspecified chronic kidney disease: Secondary | ICD-10-CM | POA: Diagnosis not present

## 2020-07-09 DIAGNOSIS — N184 Chronic kidney disease, stage 4 (severe): Secondary | ICD-10-CM | POA: Diagnosis not present

## 2020-07-09 DIAGNOSIS — E1122 Type 2 diabetes mellitus with diabetic chronic kidney disease: Secondary | ICD-10-CM | POA: Diagnosis not present

## 2020-07-09 DIAGNOSIS — E7849 Other hyperlipidemia: Secondary | ICD-10-CM | POA: Diagnosis not present

## 2020-07-16 DIAGNOSIS — E1140 Type 2 diabetes mellitus with diabetic neuropathy, unspecified: Secondary | ICD-10-CM | POA: Diagnosis not present

## 2020-07-16 DIAGNOSIS — D519 Vitamin B12 deficiency anemia, unspecified: Secondary | ICD-10-CM | POA: Diagnosis not present

## 2020-07-16 DIAGNOSIS — E1165 Type 2 diabetes mellitus with hyperglycemia: Secondary | ICD-10-CM | POA: Diagnosis not present

## 2020-07-16 DIAGNOSIS — E7849 Other hyperlipidemia: Secondary | ICD-10-CM | POA: Diagnosis not present

## 2020-07-16 DIAGNOSIS — E782 Mixed hyperlipidemia: Secondary | ICD-10-CM | POA: Diagnosis not present

## 2020-07-16 DIAGNOSIS — D529 Folate deficiency anemia, unspecified: Secondary | ICD-10-CM | POA: Diagnosis not present

## 2020-07-16 DIAGNOSIS — E1122 Type 2 diabetes mellitus with diabetic chronic kidney disease: Secondary | ICD-10-CM | POA: Diagnosis not present

## 2020-07-16 DIAGNOSIS — Z79891 Long term (current) use of opiate analgesic: Secondary | ICD-10-CM | POA: Diagnosis not present

## 2020-07-16 DIAGNOSIS — N184 Chronic kidney disease, stage 4 (severe): Secondary | ICD-10-CM | POA: Diagnosis not present

## 2020-07-16 DIAGNOSIS — I10 Essential (primary) hypertension: Secondary | ICD-10-CM | POA: Diagnosis not present

## 2020-07-19 DIAGNOSIS — Z7189 Other specified counseling: Secondary | ICD-10-CM | POA: Diagnosis not present

## 2020-07-19 DIAGNOSIS — E1122 Type 2 diabetes mellitus with diabetic chronic kidney disease: Secondary | ICD-10-CM | POA: Diagnosis not present

## 2020-07-19 DIAGNOSIS — R4582 Worries: Secondary | ICD-10-CM | POA: Diagnosis not present

## 2020-07-19 DIAGNOSIS — E782 Mixed hyperlipidemia: Secondary | ICD-10-CM | POA: Diagnosis not present

## 2020-07-19 DIAGNOSIS — I10 Essential (primary) hypertension: Secondary | ICD-10-CM | POA: Diagnosis not present

## 2020-07-19 DIAGNOSIS — G500 Trigeminal neuralgia: Secondary | ICD-10-CM | POA: Diagnosis not present

## 2020-07-19 DIAGNOSIS — Z79891 Long term (current) use of opiate analgesic: Secondary | ICD-10-CM | POA: Diagnosis not present

## 2020-08-07 DIAGNOSIS — I129 Hypertensive chronic kidney disease with stage 1 through stage 4 chronic kidney disease, or unspecified chronic kidney disease: Secondary | ICD-10-CM | POA: Diagnosis not present

## 2020-08-07 DIAGNOSIS — N184 Chronic kidney disease, stage 4 (severe): Secondary | ICD-10-CM | POA: Diagnosis not present

## 2020-08-07 DIAGNOSIS — E1122 Type 2 diabetes mellitus with diabetic chronic kidney disease: Secondary | ICD-10-CM | POA: Diagnosis not present

## 2020-08-07 DIAGNOSIS — E7849 Other hyperlipidemia: Secondary | ICD-10-CM | POA: Diagnosis not present

## 2020-08-10 DIAGNOSIS — Z23 Encounter for immunization: Secondary | ICD-10-CM | POA: Diagnosis not present

## 2020-09-06 DIAGNOSIS — I129 Hypertensive chronic kidney disease with stage 1 through stage 4 chronic kidney disease, or unspecified chronic kidney disease: Secondary | ICD-10-CM | POA: Diagnosis not present

## 2020-09-06 DIAGNOSIS — E1122 Type 2 diabetes mellitus with diabetic chronic kidney disease: Secondary | ICD-10-CM | POA: Diagnosis not present

## 2020-09-06 DIAGNOSIS — N184 Chronic kidney disease, stage 4 (severe): Secondary | ICD-10-CM | POA: Diagnosis not present

## 2020-09-06 DIAGNOSIS — E7849 Other hyperlipidemia: Secondary | ICD-10-CM | POA: Diagnosis not present

## 2020-10-06 DIAGNOSIS — N184 Chronic kidney disease, stage 4 (severe): Secondary | ICD-10-CM | POA: Diagnosis not present

## 2020-10-06 DIAGNOSIS — I129 Hypertensive chronic kidney disease with stage 1 through stage 4 chronic kidney disease, or unspecified chronic kidney disease: Secondary | ICD-10-CM | POA: Diagnosis not present

## 2020-10-06 DIAGNOSIS — E7849 Other hyperlipidemia: Secondary | ICD-10-CM | POA: Diagnosis not present

## 2020-10-06 DIAGNOSIS — E1122 Type 2 diabetes mellitus with diabetic chronic kidney disease: Secondary | ICD-10-CM | POA: Diagnosis not present

## 2020-10-18 DIAGNOSIS — E782 Mixed hyperlipidemia: Secondary | ICD-10-CM | POA: Diagnosis not present

## 2020-10-18 DIAGNOSIS — E1122 Type 2 diabetes mellitus with diabetic chronic kidney disease: Secondary | ICD-10-CM | POA: Diagnosis not present

## 2020-10-18 DIAGNOSIS — E1140 Type 2 diabetes mellitus with diabetic neuropathy, unspecified: Secondary | ICD-10-CM | POA: Diagnosis not present

## 2020-10-18 DIAGNOSIS — R946 Abnormal results of thyroid function studies: Secondary | ICD-10-CM | POA: Diagnosis not present

## 2020-10-18 DIAGNOSIS — E1165 Type 2 diabetes mellitus with hyperglycemia: Secondary | ICD-10-CM | POA: Diagnosis not present

## 2020-10-18 DIAGNOSIS — N184 Chronic kidney disease, stage 4 (severe): Secondary | ICD-10-CM | POA: Diagnosis not present

## 2020-10-18 DIAGNOSIS — E7849 Other hyperlipidemia: Secondary | ICD-10-CM | POA: Diagnosis not present

## 2020-10-18 DIAGNOSIS — I10 Essential (primary) hypertension: Secondary | ICD-10-CM | POA: Diagnosis not present

## 2020-10-21 DIAGNOSIS — I10 Essential (primary) hypertension: Secondary | ICD-10-CM | POA: Diagnosis not present

## 2020-10-21 DIAGNOSIS — E1140 Type 2 diabetes mellitus with diabetic neuropathy, unspecified: Secondary | ICD-10-CM | POA: Diagnosis not present

## 2020-10-21 DIAGNOSIS — E7849 Other hyperlipidemia: Secondary | ICD-10-CM | POA: Diagnosis not present

## 2020-10-21 DIAGNOSIS — R4582 Worries: Secondary | ICD-10-CM | POA: Diagnosis not present

## 2020-10-21 DIAGNOSIS — G500 Trigeminal neuralgia: Secondary | ICD-10-CM | POA: Diagnosis not present

## 2020-10-21 DIAGNOSIS — Z79891 Long term (current) use of opiate analgesic: Secondary | ICD-10-CM | POA: Diagnosis not present

## 2020-10-21 DIAGNOSIS — E1122 Type 2 diabetes mellitus with diabetic chronic kidney disease: Secondary | ICD-10-CM | POA: Diagnosis not present

## 2020-12-06 DIAGNOSIS — I129 Hypertensive chronic kidney disease with stage 1 through stage 4 chronic kidney disease, or unspecified chronic kidney disease: Secondary | ICD-10-CM | POA: Diagnosis not present

## 2020-12-06 DIAGNOSIS — E7849 Other hyperlipidemia: Secondary | ICD-10-CM | POA: Diagnosis not present

## 2020-12-06 DIAGNOSIS — E1122 Type 2 diabetes mellitus with diabetic chronic kidney disease: Secondary | ICD-10-CM | POA: Diagnosis not present

## 2020-12-06 DIAGNOSIS — N184 Chronic kidney disease, stage 4 (severe): Secondary | ICD-10-CM | POA: Diagnosis not present

## 2020-12-13 DIAGNOSIS — E039 Hypothyroidism, unspecified: Secondary | ICD-10-CM | POA: Diagnosis not present

## 2021-01-06 DIAGNOSIS — N184 Chronic kidney disease, stage 4 (severe): Secondary | ICD-10-CM | POA: Diagnosis not present

## 2021-01-06 DIAGNOSIS — E1122 Type 2 diabetes mellitus with diabetic chronic kidney disease: Secondary | ICD-10-CM | POA: Diagnosis not present

## 2021-01-06 DIAGNOSIS — E7849 Other hyperlipidemia: Secondary | ICD-10-CM | POA: Diagnosis not present

## 2021-01-06 DIAGNOSIS — I129 Hypertensive chronic kidney disease with stage 1 through stage 4 chronic kidney disease, or unspecified chronic kidney disease: Secondary | ICD-10-CM | POA: Diagnosis not present

## 2021-01-13 DIAGNOSIS — E1122 Type 2 diabetes mellitus with diabetic chronic kidney disease: Secondary | ICD-10-CM | POA: Diagnosis not present

## 2021-01-13 DIAGNOSIS — Z79891 Long term (current) use of opiate analgesic: Secondary | ICD-10-CM | POA: Diagnosis not present

## 2021-01-13 DIAGNOSIS — I10 Essential (primary) hypertension: Secondary | ICD-10-CM | POA: Diagnosis not present

## 2021-01-13 DIAGNOSIS — E039 Hypothyroidism, unspecified: Secondary | ICD-10-CM | POA: Diagnosis not present

## 2021-01-13 DIAGNOSIS — E7849 Other hyperlipidemia: Secondary | ICD-10-CM | POA: Diagnosis not present

## 2021-01-13 DIAGNOSIS — E782 Mixed hyperlipidemia: Secondary | ICD-10-CM | POA: Diagnosis not present

## 2021-01-13 DIAGNOSIS — E1142 Type 2 diabetes mellitus with diabetic polyneuropathy: Secondary | ICD-10-CM | POA: Diagnosis not present

## 2021-01-20 DIAGNOSIS — R809 Proteinuria, unspecified: Secondary | ICD-10-CM | POA: Diagnosis not present

## 2021-01-20 DIAGNOSIS — E1122 Type 2 diabetes mellitus with diabetic chronic kidney disease: Secondary | ICD-10-CM | POA: Diagnosis not present

## 2021-01-20 DIAGNOSIS — Z0001 Encounter for general adult medical examination with abnormal findings: Secondary | ICD-10-CM | POA: Diagnosis not present

## 2021-01-20 DIAGNOSIS — I10 Essential (primary) hypertension: Secondary | ICD-10-CM | POA: Diagnosis not present

## 2021-01-20 DIAGNOSIS — E7849 Other hyperlipidemia: Secondary | ICD-10-CM | POA: Diagnosis not present

## 2021-01-20 DIAGNOSIS — G500 Trigeminal neuralgia: Secondary | ICD-10-CM | POA: Diagnosis not present

## 2021-01-20 DIAGNOSIS — R4582 Worries: Secondary | ICD-10-CM | POA: Diagnosis not present

## 2021-01-20 DIAGNOSIS — Z23 Encounter for immunization: Secondary | ICD-10-CM | POA: Diagnosis not present

## 2021-03-09 DIAGNOSIS — N184 Chronic kidney disease, stage 4 (severe): Secondary | ICD-10-CM | POA: Diagnosis not present

## 2021-03-09 DIAGNOSIS — E1122 Type 2 diabetes mellitus with diabetic chronic kidney disease: Secondary | ICD-10-CM | POA: Diagnosis not present

## 2021-03-09 DIAGNOSIS — E7849 Other hyperlipidemia: Secondary | ICD-10-CM | POA: Diagnosis not present

## 2021-03-09 DIAGNOSIS — I129 Hypertensive chronic kidney disease with stage 1 through stage 4 chronic kidney disease, or unspecified chronic kidney disease: Secondary | ICD-10-CM | POA: Diagnosis not present

## 2021-04-08 DIAGNOSIS — I129 Hypertensive chronic kidney disease with stage 1 through stage 4 chronic kidney disease, or unspecified chronic kidney disease: Secondary | ICD-10-CM | POA: Diagnosis not present

## 2021-04-08 DIAGNOSIS — E1122 Type 2 diabetes mellitus with diabetic chronic kidney disease: Secondary | ICD-10-CM | POA: Diagnosis not present

## 2021-04-08 DIAGNOSIS — E7849 Other hyperlipidemia: Secondary | ICD-10-CM | POA: Diagnosis not present

## 2021-04-08 DIAGNOSIS — N184 Chronic kidney disease, stage 4 (severe): Secondary | ICD-10-CM | POA: Diagnosis not present

## 2021-04-22 DIAGNOSIS — I10 Essential (primary) hypertension: Secondary | ICD-10-CM | POA: Diagnosis not present

## 2021-04-22 DIAGNOSIS — E1165 Type 2 diabetes mellitus with hyperglycemia: Secondary | ICD-10-CM | POA: Diagnosis not present

## 2021-04-22 DIAGNOSIS — E1140 Type 2 diabetes mellitus with diabetic neuropathy, unspecified: Secondary | ICD-10-CM | POA: Diagnosis not present

## 2021-04-22 DIAGNOSIS — E782 Mixed hyperlipidemia: Secondary | ICD-10-CM | POA: Diagnosis not present

## 2021-04-22 DIAGNOSIS — E7849 Other hyperlipidemia: Secondary | ICD-10-CM | POA: Diagnosis not present

## 2021-04-22 DIAGNOSIS — E1122 Type 2 diabetes mellitus with diabetic chronic kidney disease: Secondary | ICD-10-CM | POA: Diagnosis not present

## 2021-04-22 DIAGNOSIS — N184 Chronic kidney disease, stage 4 (severe): Secondary | ICD-10-CM | POA: Diagnosis not present

## 2021-04-26 DIAGNOSIS — Z23 Encounter for immunization: Secondary | ICD-10-CM | POA: Diagnosis not present

## 2021-04-26 DIAGNOSIS — G500 Trigeminal neuralgia: Secondary | ICD-10-CM | POA: Diagnosis not present

## 2021-04-26 DIAGNOSIS — R809 Proteinuria, unspecified: Secondary | ICD-10-CM | POA: Diagnosis not present

## 2021-04-26 DIAGNOSIS — E7849 Other hyperlipidemia: Secondary | ICD-10-CM | POA: Diagnosis not present

## 2021-04-26 DIAGNOSIS — R4582 Worries: Secondary | ICD-10-CM | POA: Diagnosis not present

## 2021-04-26 DIAGNOSIS — I10 Essential (primary) hypertension: Secondary | ICD-10-CM | POA: Diagnosis not present

## 2021-04-26 DIAGNOSIS — E1122 Type 2 diabetes mellitus with diabetic chronic kidney disease: Secondary | ICD-10-CM | POA: Diagnosis not present

## 2021-04-26 DIAGNOSIS — E1140 Type 2 diabetes mellitus with diabetic neuropathy, unspecified: Secondary | ICD-10-CM | POA: Diagnosis not present

## 2021-06-08 DIAGNOSIS — I129 Hypertensive chronic kidney disease with stage 1 through stage 4 chronic kidney disease, or unspecified chronic kidney disease: Secondary | ICD-10-CM | POA: Diagnosis not present

## 2021-06-08 DIAGNOSIS — E7849 Other hyperlipidemia: Secondary | ICD-10-CM | POA: Diagnosis not present

## 2021-06-08 DIAGNOSIS — N184 Chronic kidney disease, stage 4 (severe): Secondary | ICD-10-CM | POA: Diagnosis not present

## 2021-06-08 DIAGNOSIS — E1122 Type 2 diabetes mellitus with diabetic chronic kidney disease: Secondary | ICD-10-CM | POA: Diagnosis not present

## 2021-07-08 DIAGNOSIS — N184 Chronic kidney disease, stage 4 (severe): Secondary | ICD-10-CM | POA: Diagnosis not present

## 2021-07-08 DIAGNOSIS — E1122 Type 2 diabetes mellitus with diabetic chronic kidney disease: Secondary | ICD-10-CM | POA: Diagnosis not present

## 2021-07-08 DIAGNOSIS — E7849 Other hyperlipidemia: Secondary | ICD-10-CM | POA: Diagnosis not present

## 2021-07-08 DIAGNOSIS — I129 Hypertensive chronic kidney disease with stage 1 through stage 4 chronic kidney disease, or unspecified chronic kidney disease: Secondary | ICD-10-CM | POA: Diagnosis not present

## 2021-07-21 DIAGNOSIS — N183 Chronic kidney disease, stage 3 unspecified: Secondary | ICD-10-CM | POA: Diagnosis not present

## 2021-07-21 DIAGNOSIS — E782 Mixed hyperlipidemia: Secondary | ICD-10-CM | POA: Diagnosis not present

## 2021-07-21 DIAGNOSIS — E1122 Type 2 diabetes mellitus with diabetic chronic kidney disease: Secondary | ICD-10-CM | POA: Diagnosis not present

## 2021-07-21 DIAGNOSIS — E7849 Other hyperlipidemia: Secondary | ICD-10-CM | POA: Diagnosis not present

## 2021-08-01 DIAGNOSIS — N184 Chronic kidney disease, stage 4 (severe): Secondary | ICD-10-CM | POA: Diagnosis not present

## 2021-08-01 DIAGNOSIS — R262 Difficulty in walking, not elsewhere classified: Secondary | ICD-10-CM | POA: Diagnosis not present

## 2021-08-01 DIAGNOSIS — I69322 Dysarthria following cerebral infarction: Secondary | ICD-10-CM | POA: Diagnosis not present

## 2021-08-01 DIAGNOSIS — R6889 Other general symptoms and signs: Secondary | ICD-10-CM | POA: Diagnosis not present

## 2021-08-01 DIAGNOSIS — I69391 Dysphagia following cerebral infarction: Secondary | ICD-10-CM | POA: Diagnosis not present

## 2021-08-01 DIAGNOSIS — R5381 Other malaise: Secondary | ICD-10-CM | POA: Diagnosis not present

## 2021-08-01 DIAGNOSIS — I129 Hypertensive chronic kidney disease with stage 1 through stage 4 chronic kidney disease, or unspecified chronic kidney disease: Secondary | ICD-10-CM | POA: Diagnosis not present

## 2021-08-01 DIAGNOSIS — R279 Unspecified lack of coordination: Secondary | ICD-10-CM | POA: Diagnosis not present

## 2021-08-01 DIAGNOSIS — N200 Calculus of kidney: Secondary | ICD-10-CM | POA: Diagnosis not present

## 2021-08-01 DIAGNOSIS — E782 Mixed hyperlipidemia: Secondary | ICD-10-CM | POA: Diagnosis not present

## 2021-08-01 DIAGNOSIS — R531 Weakness: Secondary | ICD-10-CM | POA: Diagnosis not present

## 2021-08-01 DIAGNOSIS — N3000 Acute cystitis without hematuria: Secondary | ICD-10-CM | POA: Diagnosis not present

## 2021-08-01 DIAGNOSIS — N390 Urinary tract infection, site not specified: Secondary | ICD-10-CM | POA: Diagnosis not present

## 2021-08-01 DIAGNOSIS — M19071 Primary osteoarthritis, right ankle and foot: Secondary | ICD-10-CM | POA: Diagnosis not present

## 2021-08-01 DIAGNOSIS — M170 Bilateral primary osteoarthritis of knee: Secondary | ICD-10-CM | POA: Diagnosis not present

## 2021-08-01 DIAGNOSIS — I10 Essential (primary) hypertension: Secondary | ICD-10-CM | POA: Diagnosis not present

## 2021-08-01 DIAGNOSIS — N1330 Unspecified hydronephrosis: Secondary | ICD-10-CM | POA: Diagnosis not present

## 2021-08-01 DIAGNOSIS — S299XXA Unspecified injury of thorax, initial encounter: Secondary | ICD-10-CM | POA: Diagnosis not present

## 2021-08-01 DIAGNOSIS — R54 Age-related physical debility: Secondary | ICD-10-CM | POA: Diagnosis not present

## 2021-08-01 DIAGNOSIS — N179 Acute kidney failure, unspecified: Secondary | ICD-10-CM | POA: Diagnosis not present

## 2021-08-01 DIAGNOSIS — S3993XA Unspecified injury of pelvis, initial encounter: Secondary | ICD-10-CM | POA: Diagnosis not present

## 2021-08-01 DIAGNOSIS — Z87891 Personal history of nicotine dependence: Secondary | ICD-10-CM | POA: Diagnosis not present

## 2021-08-01 DIAGNOSIS — Z7901 Long term (current) use of anticoagulants: Secondary | ICD-10-CM | POA: Diagnosis not present

## 2021-08-01 DIAGNOSIS — Z743 Need for continuous supervision: Secondary | ICD-10-CM | POA: Diagnosis not present

## 2021-08-01 DIAGNOSIS — M2011 Hallux valgus (acquired), right foot: Secondary | ICD-10-CM | POA: Diagnosis not present

## 2021-08-01 DIAGNOSIS — N136 Pyonephrosis: Secondary | ICD-10-CM | POA: Diagnosis not present

## 2021-08-01 DIAGNOSIS — Z7401 Bed confinement status: Secondary | ICD-10-CM | POA: Diagnosis not present

## 2021-08-01 DIAGNOSIS — M5136 Other intervertebral disc degeneration, lumbar region: Secondary | ICD-10-CM | POA: Diagnosis not present

## 2021-08-01 DIAGNOSIS — Z8673 Personal history of transient ischemic attack (TIA), and cerebral infarction without residual deficits: Secondary | ICD-10-CM | POA: Diagnosis not present

## 2021-08-01 DIAGNOSIS — I6981 Attention and concentration deficit following other cerebrovascular disease: Secondary | ICD-10-CM | POA: Diagnosis not present

## 2021-08-01 DIAGNOSIS — W19XXXA Unspecified fall, initial encounter: Secondary | ICD-10-CM | POA: Diagnosis not present

## 2021-08-01 DIAGNOSIS — I6522 Occlusion and stenosis of left carotid artery: Secondary | ICD-10-CM | POA: Diagnosis not present

## 2021-08-01 DIAGNOSIS — I517 Cardiomegaly: Secondary | ICD-10-CM | POA: Diagnosis not present

## 2021-08-01 DIAGNOSIS — R29700 NIHSS score 0: Secondary | ICD-10-CM | POA: Diagnosis not present

## 2021-08-01 DIAGNOSIS — S81801A Unspecified open wound, right lower leg, initial encounter: Secondary | ICD-10-CM | POA: Diagnosis not present

## 2021-08-01 DIAGNOSIS — Z885 Allergy status to narcotic agent status: Secondary | ICD-10-CM | POA: Diagnosis not present

## 2021-08-01 DIAGNOSIS — M6281 Muscle weakness (generalized): Secondary | ICD-10-CM | POA: Diagnosis not present

## 2021-08-01 DIAGNOSIS — J9811 Atelectasis: Secondary | ICD-10-CM | POA: Diagnosis not present

## 2021-08-01 DIAGNOSIS — E875 Hyperkalemia: Secondary | ICD-10-CM | POA: Diagnosis not present

## 2021-08-01 DIAGNOSIS — R278 Other lack of coordination: Secondary | ICD-10-CM | POA: Diagnosis not present

## 2021-08-01 DIAGNOSIS — Z20822 Contact with and (suspected) exposure to covid-19: Secondary | ICD-10-CM | POA: Diagnosis not present

## 2021-08-01 DIAGNOSIS — R55 Syncope and collapse: Secondary | ICD-10-CM | POA: Diagnosis not present

## 2021-08-01 DIAGNOSIS — I6381 Other cerebral infarction due to occlusion or stenosis of small artery: Secondary | ICD-10-CM | POA: Diagnosis not present

## 2021-08-01 DIAGNOSIS — I083 Combined rheumatic disorders of mitral, aortic and tricuspid valves: Secondary | ICD-10-CM | POA: Diagnosis not present

## 2021-08-01 DIAGNOSIS — S0990XA Unspecified injury of head, initial encounter: Secondary | ICD-10-CM | POA: Diagnosis not present

## 2021-08-01 DIAGNOSIS — I7121 Aneurysm of the ascending aorta, without rupture: Secondary | ICD-10-CM | POA: Diagnosis not present

## 2021-08-01 DIAGNOSIS — I63311 Cerebral infarction due to thrombosis of right middle cerebral artery: Secondary | ICD-10-CM | POA: Diagnosis not present

## 2021-08-01 DIAGNOSIS — I639 Cerebral infarction, unspecified: Secondary | ICD-10-CM | POA: Diagnosis not present

## 2021-08-01 DIAGNOSIS — H04123 Dry eye syndrome of bilateral lacrimal glands: Secondary | ICD-10-CM | POA: Diagnosis not present

## 2021-08-01 DIAGNOSIS — E1122 Type 2 diabetes mellitus with diabetic chronic kidney disease: Secondary | ICD-10-CM | POA: Diagnosis not present

## 2021-08-01 DIAGNOSIS — G500 Trigeminal neuralgia: Secondary | ICD-10-CM | POA: Diagnosis not present

## 2021-08-12 DIAGNOSIS — M6281 Muscle weakness (generalized): Secondary | ICD-10-CM | POA: Diagnosis not present

## 2021-08-12 DIAGNOSIS — G500 Trigeminal neuralgia: Secondary | ICD-10-CM | POA: Diagnosis not present

## 2021-08-12 DIAGNOSIS — I10 Essential (primary) hypertension: Secondary | ICD-10-CM | POA: Diagnosis not present

## 2021-08-12 DIAGNOSIS — R55 Syncope and collapse: Secondary | ICD-10-CM | POA: Diagnosis not present

## 2021-08-12 DIAGNOSIS — R52 Pain, unspecified: Secondary | ICD-10-CM | POA: Diagnosis not present

## 2021-08-12 DIAGNOSIS — I7121 Aneurysm of the ascending aorta, without rupture: Secondary | ICD-10-CM | POA: Diagnosis not present

## 2021-08-12 DIAGNOSIS — H04123 Dry eye syndrome of bilateral lacrimal glands: Secondary | ICD-10-CM | POA: Diagnosis not present

## 2021-08-12 DIAGNOSIS — I63311 Cerebral infarction due to thrombosis of right middle cerebral artery: Secondary | ICD-10-CM | POA: Diagnosis not present

## 2021-08-12 DIAGNOSIS — E1122 Type 2 diabetes mellitus with diabetic chronic kidney disease: Secondary | ICD-10-CM | POA: Diagnosis not present

## 2021-08-12 DIAGNOSIS — I69391 Dysphagia following cerebral infarction: Secondary | ICD-10-CM | POA: Diagnosis not present

## 2021-08-12 DIAGNOSIS — I2510 Atherosclerotic heart disease of native coronary artery without angina pectoris: Secondary | ICD-10-CM | POA: Diagnosis not present

## 2021-08-12 DIAGNOSIS — I639 Cerebral infarction, unspecified: Secondary | ICD-10-CM | POA: Diagnosis not present

## 2021-08-12 DIAGNOSIS — R5381 Other malaise: Secondary | ICD-10-CM | POA: Diagnosis not present

## 2021-08-12 DIAGNOSIS — R262 Difficulty in walking, not elsewhere classified: Secondary | ICD-10-CM | POA: Diagnosis not present

## 2021-08-12 DIAGNOSIS — M170 Bilateral primary osteoarthritis of knee: Secondary | ICD-10-CM | POA: Diagnosis not present

## 2021-08-12 DIAGNOSIS — I69322 Dysarthria following cerebral infarction: Secondary | ICD-10-CM | POA: Diagnosis not present

## 2021-08-12 DIAGNOSIS — R54 Age-related physical debility: Secondary | ICD-10-CM | POA: Diagnosis not present

## 2021-08-12 DIAGNOSIS — I6981 Attention and concentration deficit following other cerebrovascular disease: Secondary | ICD-10-CM | POA: Diagnosis not present

## 2021-08-12 DIAGNOSIS — E782 Mixed hyperlipidemia: Secondary | ICD-10-CM | POA: Diagnosis not present

## 2021-08-12 DIAGNOSIS — R600 Localized edema: Secondary | ICD-10-CM | POA: Diagnosis not present

## 2021-08-12 DIAGNOSIS — R279 Unspecified lack of coordination: Secondary | ICD-10-CM | POA: Diagnosis not present

## 2021-08-12 DIAGNOSIS — E119 Type 2 diabetes mellitus without complications: Secondary | ICD-10-CM | POA: Diagnosis not present

## 2021-08-12 DIAGNOSIS — R0602 Shortness of breath: Secondary | ICD-10-CM | POA: Diagnosis not present

## 2021-08-12 DIAGNOSIS — R278 Other lack of coordination: Secondary | ICD-10-CM | POA: Diagnosis not present

## 2021-08-12 DIAGNOSIS — I501 Left ventricular failure: Secondary | ICD-10-CM | POA: Diagnosis not present

## 2021-08-12 DIAGNOSIS — Z7401 Bed confinement status: Secondary | ICD-10-CM | POA: Diagnosis not present

## 2021-08-16 DIAGNOSIS — I2510 Atherosclerotic heart disease of native coronary artery without angina pectoris: Secondary | ICD-10-CM | POA: Diagnosis not present

## 2021-08-16 DIAGNOSIS — I639 Cerebral infarction, unspecified: Secondary | ICD-10-CM | POA: Diagnosis not present

## 2021-08-22 DIAGNOSIS — E119 Type 2 diabetes mellitus without complications: Secondary | ICD-10-CM | POA: Diagnosis not present

## 2021-09-05 DIAGNOSIS — G500 Trigeminal neuralgia: Secondary | ICD-10-CM | POA: Diagnosis not present

## 2021-09-05 DIAGNOSIS — M170 Bilateral primary osteoarthritis of knee: Secondary | ICD-10-CM | POA: Diagnosis not present

## 2021-09-05 DIAGNOSIS — R52 Pain, unspecified: Secondary | ICD-10-CM | POA: Diagnosis not present

## 2021-09-05 DIAGNOSIS — I501 Left ventricular failure: Secondary | ICD-10-CM | POA: Diagnosis not present

## 2021-09-07 DIAGNOSIS — I2510 Atherosclerotic heart disease of native coronary artery without angina pectoris: Secondary | ICD-10-CM | POA: Diagnosis not present

## 2021-09-07 DIAGNOSIS — I639 Cerebral infarction, unspecified: Secondary | ICD-10-CM | POA: Diagnosis not present

## 2021-09-07 DIAGNOSIS — R0602 Shortness of breath: Secondary | ICD-10-CM | POA: Diagnosis not present

## 2021-09-07 DIAGNOSIS — R600 Localized edema: Secondary | ICD-10-CM | POA: Diagnosis not present

## 2021-09-10 DIAGNOSIS — Z743 Need for continuous supervision: Secondary | ICD-10-CM | POA: Diagnosis not present

## 2021-09-10 DIAGNOSIS — M170 Bilateral primary osteoarthritis of knee: Secondary | ICD-10-CM | POA: Diagnosis not present

## 2021-09-10 DIAGNOSIS — L89523 Pressure ulcer of left ankle, stage 3: Secondary | ICD-10-CM | POA: Diagnosis not present

## 2021-09-10 DIAGNOSIS — N390 Urinary tract infection, site not specified: Secondary | ICD-10-CM | POA: Diagnosis not present

## 2021-09-10 DIAGNOSIS — M6281 Muscle weakness (generalized): Secondary | ICD-10-CM | POA: Diagnosis not present

## 2021-09-10 DIAGNOSIS — Z20822 Contact with and (suspected) exposure to covid-19: Secondary | ICD-10-CM | POA: Diagnosis not present

## 2021-09-10 DIAGNOSIS — R0689 Other abnormalities of breathing: Secondary | ICD-10-CM | POA: Diagnosis not present

## 2021-09-10 DIAGNOSIS — R609 Edema, unspecified: Secondary | ICD-10-CM | POA: Diagnosis not present

## 2021-09-10 DIAGNOSIS — D692 Other nonthrombocytopenic purpura: Secondary | ICD-10-CM | POA: Diagnosis not present

## 2021-09-10 DIAGNOSIS — R54 Age-related physical debility: Secondary | ICD-10-CM | POA: Diagnosis not present

## 2021-09-10 DIAGNOSIS — G319 Degenerative disease of nervous system, unspecified: Secondary | ICD-10-CM | POA: Diagnosis not present

## 2021-09-10 DIAGNOSIS — I63311 Cerebral infarction due to thrombosis of right middle cerebral artery: Secondary | ICD-10-CM | POA: Diagnosis not present

## 2021-09-10 DIAGNOSIS — I6381 Other cerebral infarction due to occlusion or stenosis of small artery: Secondary | ICD-10-CM | POA: Diagnosis not present

## 2021-09-10 DIAGNOSIS — R5381 Other malaise: Secondary | ICD-10-CM | POA: Diagnosis not present

## 2021-09-10 DIAGNOSIS — R52 Pain, unspecified: Secondary | ICD-10-CM | POA: Diagnosis not present

## 2021-09-10 DIAGNOSIS — E876 Hypokalemia: Secondary | ICD-10-CM | POA: Diagnosis not present

## 2021-09-10 DIAGNOSIS — M25562 Pain in left knee: Secondary | ICD-10-CM | POA: Diagnosis not present

## 2021-09-10 DIAGNOSIS — J9621 Acute and chronic respiratory failure with hypoxia: Secondary | ICD-10-CM | POA: Diagnosis not present

## 2021-09-10 DIAGNOSIS — R262 Difficulty in walking, not elsewhere classified: Secondary | ICD-10-CM | POA: Diagnosis not present

## 2021-09-10 DIAGNOSIS — A419 Sepsis, unspecified organism: Secondary | ICD-10-CM | POA: Diagnosis not present

## 2021-09-10 DIAGNOSIS — R278 Other lack of coordination: Secondary | ICD-10-CM | POA: Diagnosis not present

## 2021-09-10 DIAGNOSIS — R0902 Hypoxemia: Secondary | ICD-10-CM | POA: Diagnosis not present

## 2021-09-10 DIAGNOSIS — I69391 Dysphagia following cerebral infarction: Secondary | ICD-10-CM | POA: Diagnosis not present

## 2021-09-10 DIAGNOSIS — I7121 Aneurysm of the ascending aorta, without rupture: Secondary | ICD-10-CM | POA: Diagnosis not present

## 2021-09-10 DIAGNOSIS — G8929 Other chronic pain: Secondary | ICD-10-CM | POA: Diagnosis not present

## 2021-09-10 DIAGNOSIS — Z7401 Bed confinement status: Secondary | ICD-10-CM | POA: Diagnosis not present

## 2021-09-10 DIAGNOSIS — E11622 Type 2 diabetes mellitus with other skin ulcer: Secondary | ICD-10-CM | POA: Diagnosis not present

## 2021-09-10 DIAGNOSIS — N1832 Chronic kidney disease, stage 3b: Secondary | ICD-10-CM | POA: Diagnosis not present

## 2021-09-10 DIAGNOSIS — I6981 Attention and concentration deficit following other cerebrovascular disease: Secondary | ICD-10-CM | POA: Diagnosis not present

## 2021-09-10 DIAGNOSIS — I517 Cardiomegaly: Secondary | ICD-10-CM | POA: Diagnosis not present

## 2021-09-10 DIAGNOSIS — H04123 Dry eye syndrome of bilateral lacrimal glands: Secondary | ICD-10-CM | POA: Diagnosis not present

## 2021-09-10 DIAGNOSIS — M25561 Pain in right knee: Secondary | ICD-10-CM | POA: Diagnosis not present

## 2021-09-10 DIAGNOSIS — I499 Cardiac arrhythmia, unspecified: Secondary | ICD-10-CM | POA: Diagnosis not present

## 2021-09-10 DIAGNOSIS — E782 Mixed hyperlipidemia: Secondary | ICD-10-CM | POA: Diagnosis not present

## 2021-09-10 DIAGNOSIS — R55 Syncope and collapse: Secondary | ICD-10-CM | POA: Diagnosis not present

## 2021-09-10 DIAGNOSIS — I10 Essential (primary) hypertension: Secondary | ICD-10-CM | POA: Diagnosis not present

## 2021-09-10 DIAGNOSIS — I129 Hypertensive chronic kidney disease with stage 1 through stage 4 chronic kidney disease, or unspecified chronic kidney disease: Secondary | ICD-10-CM | POA: Diagnosis not present

## 2021-09-10 DIAGNOSIS — E1122 Type 2 diabetes mellitus with diabetic chronic kidney disease: Secondary | ICD-10-CM | POA: Diagnosis not present

## 2021-09-10 DIAGNOSIS — E441 Mild protein-calorie malnutrition: Secondary | ICD-10-CM | POA: Diagnosis not present

## 2021-09-10 DIAGNOSIS — I700 Atherosclerosis of aorta: Secondary | ICD-10-CM | POA: Diagnosis not present

## 2021-09-10 DIAGNOSIS — I351 Nonrheumatic aortic (valve) insufficiency: Secondary | ICD-10-CM | POA: Diagnosis not present

## 2021-09-10 DIAGNOSIS — G500 Trigeminal neuralgia: Secondary | ICD-10-CM | POA: Diagnosis not present

## 2021-09-10 DIAGNOSIS — J811 Chronic pulmonary edema: Secondary | ICD-10-CM | POA: Diagnosis not present

## 2021-09-10 DIAGNOSIS — I69322 Dysarthria following cerebral infarction: Secondary | ICD-10-CM | POA: Diagnosis not present

## 2021-09-10 DIAGNOSIS — M5136 Other intervertebral disc degeneration, lumbar region: Secondary | ICD-10-CM | POA: Diagnosis not present

## 2021-09-21 DIAGNOSIS — I63311 Cerebral infarction due to thrombosis of right middle cerebral artery: Secondary | ICD-10-CM | POA: Diagnosis not present

## 2021-09-21 DIAGNOSIS — E119 Type 2 diabetes mellitus without complications: Secondary | ICD-10-CM | POA: Diagnosis not present

## 2021-09-21 DIAGNOSIS — M170 Bilateral primary osteoarthritis of knee: Secondary | ICD-10-CM | POA: Diagnosis not present

## 2021-09-21 DIAGNOSIS — G500 Trigeminal neuralgia: Secondary | ICD-10-CM | POA: Diagnosis not present

## 2021-09-21 DIAGNOSIS — Z743 Need for continuous supervision: Secondary | ICD-10-CM | POA: Diagnosis not present

## 2021-09-21 DIAGNOSIS — Z760 Encounter for issue of repeat prescription: Secondary | ICD-10-CM | POA: Diagnosis not present

## 2021-09-21 DIAGNOSIS — R278 Other lack of coordination: Secondary | ICD-10-CM | POA: Diagnosis not present

## 2021-09-21 DIAGNOSIS — H04123 Dry eye syndrome of bilateral lacrimal glands: Secondary | ICD-10-CM | POA: Diagnosis not present

## 2021-09-21 DIAGNOSIS — I7121 Aneurysm of the ascending aorta, without rupture: Secondary | ICD-10-CM | POA: Diagnosis not present

## 2021-09-21 DIAGNOSIS — M6281 Muscle weakness (generalized): Secondary | ICD-10-CM | POA: Diagnosis not present

## 2021-09-21 DIAGNOSIS — R52 Pain, unspecified: Secondary | ICD-10-CM | POA: Diagnosis not present

## 2021-09-21 DIAGNOSIS — Z79899 Other long term (current) drug therapy: Secondary | ICD-10-CM | POA: Diagnosis not present

## 2021-09-21 DIAGNOSIS — E782 Mixed hyperlipidemia: Secondary | ICD-10-CM | POA: Diagnosis not present

## 2021-09-21 DIAGNOSIS — R54 Age-related physical debility: Secondary | ICD-10-CM | POA: Diagnosis not present

## 2021-09-21 DIAGNOSIS — R5381 Other malaise: Secondary | ICD-10-CM | POA: Diagnosis not present

## 2021-09-21 DIAGNOSIS — I10 Essential (primary) hypertension: Secondary | ICD-10-CM | POA: Diagnosis not present

## 2021-09-21 DIAGNOSIS — E1122 Type 2 diabetes mellitus with diabetic chronic kidney disease: Secondary | ICD-10-CM | POA: Diagnosis not present

## 2021-09-21 DIAGNOSIS — I69322 Dysarthria following cerebral infarction: Secondary | ICD-10-CM | POA: Diagnosis not present

## 2021-09-21 DIAGNOSIS — L97323 Non-pressure chronic ulcer of left ankle with necrosis of muscle: Secondary | ICD-10-CM | POA: Diagnosis not present

## 2021-09-21 DIAGNOSIS — Z7401 Bed confinement status: Secondary | ICD-10-CM | POA: Diagnosis not present

## 2021-09-21 DIAGNOSIS — R262 Difficulty in walking, not elsewhere classified: Secondary | ICD-10-CM | POA: Diagnosis not present

## 2021-09-21 DIAGNOSIS — N390 Urinary tract infection, site not specified: Secondary | ICD-10-CM | POA: Diagnosis not present

## 2021-09-21 DIAGNOSIS — I69391 Dysphagia following cerebral infarction: Secondary | ICD-10-CM | POA: Diagnosis not present

## 2021-09-21 DIAGNOSIS — E441 Mild protein-calorie malnutrition: Secondary | ICD-10-CM | POA: Diagnosis not present

## 2021-09-21 DIAGNOSIS — I739 Peripheral vascular disease, unspecified: Secondary | ICD-10-CM | POA: Diagnosis not present

## 2021-09-21 DIAGNOSIS — R55 Syncope and collapse: Secondary | ICD-10-CM | POA: Diagnosis not present

## 2021-09-21 DIAGNOSIS — L89524 Pressure ulcer of left ankle, stage 4: Secondary | ICD-10-CM | POA: Diagnosis not present

## 2021-09-21 DIAGNOSIS — A419 Sepsis, unspecified organism: Secondary | ICD-10-CM | POA: Diagnosis not present

## 2021-09-21 DIAGNOSIS — I6981 Attention and concentration deficit following other cerebrovascular disease: Secondary | ICD-10-CM | POA: Diagnosis not present

## 2021-09-21 DIAGNOSIS — I6381 Other cerebral infarction due to occlusion or stenosis of small artery: Secondary | ICD-10-CM | POA: Diagnosis not present

## 2021-09-21 DIAGNOSIS — R609 Edema, unspecified: Secondary | ICD-10-CM | POA: Diagnosis not present

## 2021-09-21 DIAGNOSIS — I351 Nonrheumatic aortic (valve) insufficiency: Secondary | ICD-10-CM | POA: Diagnosis not present

## 2021-09-22 DIAGNOSIS — R52 Pain, unspecified: Secondary | ICD-10-CM | POA: Diagnosis not present

## 2021-09-22 DIAGNOSIS — Z760 Encounter for issue of repeat prescription: Secondary | ICD-10-CM | POA: Diagnosis not present

## 2021-09-25 DIAGNOSIS — R52 Pain, unspecified: Secondary | ICD-10-CM | POA: Diagnosis not present

## 2021-09-25 DIAGNOSIS — Z79899 Other long term (current) drug therapy: Secondary | ICD-10-CM | POA: Diagnosis not present

## 2021-09-25 DIAGNOSIS — G500 Trigeminal neuralgia: Secondary | ICD-10-CM | POA: Diagnosis not present

## 2021-09-28 DIAGNOSIS — L97323 Non-pressure chronic ulcer of left ankle with necrosis of muscle: Secondary | ICD-10-CM | POA: Diagnosis not present

## 2021-09-28 DIAGNOSIS — E119 Type 2 diabetes mellitus without complications: Secondary | ICD-10-CM | POA: Diagnosis not present

## 2021-10-04 DIAGNOSIS — A419 Sepsis, unspecified organism: Secondary | ICD-10-CM | POA: Diagnosis not present

## 2021-10-04 DIAGNOSIS — N390 Urinary tract infection, site not specified: Secondary | ICD-10-CM | POA: Diagnosis not present

## 2021-10-05 DIAGNOSIS — L89524 Pressure ulcer of left ankle, stage 4: Secondary | ICD-10-CM | POA: Diagnosis not present

## 2021-10-09 DIAGNOSIS — M6281 Muscle weakness (generalized): Secondary | ICD-10-CM | POA: Diagnosis not present

## 2021-10-09 DIAGNOSIS — R262 Difficulty in walking, not elsewhere classified: Secondary | ICD-10-CM | POA: Diagnosis not present

## 2021-10-09 DIAGNOSIS — I69391 Dysphagia following cerebral infarction: Secondary | ICD-10-CM | POA: Diagnosis not present

## 2021-10-09 DIAGNOSIS — I6981 Attention and concentration deficit following other cerebrovascular disease: Secondary | ICD-10-CM | POA: Diagnosis not present

## 2021-10-09 DIAGNOSIS — I69322 Dysarthria following cerebral infarction: Secondary | ICD-10-CM | POA: Diagnosis not present

## 2021-10-10 DIAGNOSIS — I69322 Dysarthria following cerebral infarction: Secondary | ICD-10-CM | POA: Diagnosis not present

## 2021-10-10 DIAGNOSIS — I69391 Dysphagia following cerebral infarction: Secondary | ICD-10-CM | POA: Diagnosis not present

## 2021-10-10 DIAGNOSIS — I6981 Attention and concentration deficit following other cerebrovascular disease: Secondary | ICD-10-CM | POA: Diagnosis not present

## 2021-10-10 DIAGNOSIS — R262 Difficulty in walking, not elsewhere classified: Secondary | ICD-10-CM | POA: Diagnosis not present

## 2021-10-10 DIAGNOSIS — M6281 Muscle weakness (generalized): Secondary | ICD-10-CM | POA: Diagnosis not present

## 2021-10-11 DIAGNOSIS — H524 Presbyopia: Secondary | ICD-10-CM | POA: Diagnosis not present

## 2021-10-11 DIAGNOSIS — I6981 Attention and concentration deficit following other cerebrovascular disease: Secondary | ICD-10-CM | POA: Diagnosis not present

## 2021-10-11 DIAGNOSIS — I69322 Dysarthria following cerebral infarction: Secondary | ICD-10-CM | POA: Diagnosis not present

## 2021-10-11 DIAGNOSIS — M6281 Muscle weakness (generalized): Secondary | ICD-10-CM | POA: Diagnosis not present

## 2021-10-11 DIAGNOSIS — R262 Difficulty in walking, not elsewhere classified: Secondary | ICD-10-CM | POA: Diagnosis not present

## 2021-10-11 DIAGNOSIS — I69391 Dysphagia following cerebral infarction: Secondary | ICD-10-CM | POA: Diagnosis not present

## 2021-10-11 DIAGNOSIS — Z961 Presence of intraocular lens: Secondary | ICD-10-CM | POA: Diagnosis not present

## 2021-10-11 DIAGNOSIS — E119 Type 2 diabetes mellitus without complications: Secondary | ICD-10-CM | POA: Diagnosis not present

## 2021-10-12 DIAGNOSIS — R262 Difficulty in walking, not elsewhere classified: Secondary | ICD-10-CM | POA: Diagnosis not present

## 2021-10-12 DIAGNOSIS — I6981 Attention and concentration deficit following other cerebrovascular disease: Secondary | ICD-10-CM | POA: Diagnosis not present

## 2021-10-12 DIAGNOSIS — I69322 Dysarthria following cerebral infarction: Secondary | ICD-10-CM | POA: Diagnosis not present

## 2021-10-12 DIAGNOSIS — M6281 Muscle weakness (generalized): Secondary | ICD-10-CM | POA: Diagnosis not present

## 2021-10-12 DIAGNOSIS — I69391 Dysphagia following cerebral infarction: Secondary | ICD-10-CM | POA: Diagnosis not present

## 2021-10-13 DIAGNOSIS — I6981 Attention and concentration deficit following other cerebrovascular disease: Secondary | ICD-10-CM | POA: Diagnosis not present

## 2021-10-13 DIAGNOSIS — R262 Difficulty in walking, not elsewhere classified: Secondary | ICD-10-CM | POA: Diagnosis not present

## 2021-10-13 DIAGNOSIS — I69322 Dysarthria following cerebral infarction: Secondary | ICD-10-CM | POA: Diagnosis not present

## 2021-10-13 DIAGNOSIS — L89524 Pressure ulcer of left ankle, stage 4: Secondary | ICD-10-CM | POA: Diagnosis not present

## 2021-10-13 DIAGNOSIS — M6281 Muscle weakness (generalized): Secondary | ICD-10-CM | POA: Diagnosis not present

## 2021-10-13 DIAGNOSIS — I69391 Dysphagia following cerebral infarction: Secondary | ICD-10-CM | POA: Diagnosis not present

## 2021-10-14 DIAGNOSIS — I6981 Attention and concentration deficit following other cerebrovascular disease: Secondary | ICD-10-CM | POA: Diagnosis not present

## 2021-10-14 DIAGNOSIS — I69391 Dysphagia following cerebral infarction: Secondary | ICD-10-CM | POA: Diagnosis not present

## 2021-10-14 DIAGNOSIS — I69322 Dysarthria following cerebral infarction: Secondary | ICD-10-CM | POA: Diagnosis not present

## 2021-10-14 DIAGNOSIS — M6281 Muscle weakness (generalized): Secondary | ICD-10-CM | POA: Diagnosis not present

## 2021-10-14 DIAGNOSIS — R262 Difficulty in walking, not elsewhere classified: Secondary | ICD-10-CM | POA: Diagnosis not present

## 2021-10-17 DIAGNOSIS — I69391 Dysphagia following cerebral infarction: Secondary | ICD-10-CM | POA: Diagnosis not present

## 2021-10-17 DIAGNOSIS — I69322 Dysarthria following cerebral infarction: Secondary | ICD-10-CM | POA: Diagnosis not present

## 2021-10-17 DIAGNOSIS — I6981 Attention and concentration deficit following other cerebrovascular disease: Secondary | ICD-10-CM | POA: Diagnosis not present

## 2021-10-17 DIAGNOSIS — R262 Difficulty in walking, not elsewhere classified: Secondary | ICD-10-CM | POA: Diagnosis not present

## 2021-10-17 DIAGNOSIS — M6281 Muscle weakness (generalized): Secondary | ICD-10-CM | POA: Diagnosis not present

## 2021-10-18 DIAGNOSIS — R262 Difficulty in walking, not elsewhere classified: Secondary | ICD-10-CM | POA: Diagnosis not present

## 2021-10-18 DIAGNOSIS — M6281 Muscle weakness (generalized): Secondary | ICD-10-CM | POA: Diagnosis not present

## 2021-10-18 DIAGNOSIS — I69322 Dysarthria following cerebral infarction: Secondary | ICD-10-CM | POA: Diagnosis not present

## 2021-10-18 DIAGNOSIS — I6981 Attention and concentration deficit following other cerebrovascular disease: Secondary | ICD-10-CM | POA: Diagnosis not present

## 2021-10-18 DIAGNOSIS — I69391 Dysphagia following cerebral infarction: Secondary | ICD-10-CM | POA: Diagnosis not present

## 2021-10-19 DIAGNOSIS — I501 Left ventricular failure: Secondary | ICD-10-CM | POA: Diagnosis not present

## 2021-10-19 DIAGNOSIS — I69322 Dysarthria following cerebral infarction: Secondary | ICD-10-CM | POA: Diagnosis not present

## 2021-10-19 DIAGNOSIS — I6981 Attention and concentration deficit following other cerebrovascular disease: Secondary | ICD-10-CM | POA: Diagnosis not present

## 2021-10-19 DIAGNOSIS — I69391 Dysphagia following cerebral infarction: Secondary | ICD-10-CM | POA: Diagnosis not present

## 2021-10-19 DIAGNOSIS — M6281 Muscle weakness (generalized): Secondary | ICD-10-CM | POA: Diagnosis not present

## 2021-10-19 DIAGNOSIS — R262 Difficulty in walking, not elsewhere classified: Secondary | ICD-10-CM | POA: Diagnosis not present

## 2021-10-20 DIAGNOSIS — E860 Dehydration: Secondary | ICD-10-CM | POA: Diagnosis not present

## 2021-10-20 DIAGNOSIS — R5383 Other fatigue: Secondary | ICD-10-CM | POA: Diagnosis not present

## 2021-10-20 DIAGNOSIS — I501 Left ventricular failure: Secondary | ICD-10-CM | POA: Diagnosis not present

## 2021-10-20 DIAGNOSIS — Z79899 Other long term (current) drug therapy: Secondary | ICD-10-CM | POA: Diagnosis not present

## 2021-10-26 DIAGNOSIS — L89892 Pressure ulcer of other site, stage 2: Secondary | ICD-10-CM | POA: Diagnosis not present

## 2021-10-26 DIAGNOSIS — L89524 Pressure ulcer of left ankle, stage 4: Secondary | ICD-10-CM | POA: Diagnosis not present

## 2021-10-26 DIAGNOSIS — B351 Tinea unguium: Secondary | ICD-10-CM | POA: Diagnosis not present

## 2021-10-26 DIAGNOSIS — E1159 Type 2 diabetes mellitus with other circulatory complications: Secondary | ICD-10-CM | POA: Diagnosis not present

## 2021-11-01 DIAGNOSIS — R627 Adult failure to thrive: Secondary | ICD-10-CM | POA: Diagnosis not present

## 2021-11-01 DIAGNOSIS — I501 Left ventricular failure: Secondary | ICD-10-CM | POA: Diagnosis not present

## 2021-11-01 DIAGNOSIS — R296 Repeated falls: Secondary | ICD-10-CM | POA: Diagnosis not present

## 2021-11-02 DIAGNOSIS — L89524 Pressure ulcer of left ankle, stage 4: Secondary | ICD-10-CM | POA: Diagnosis not present

## 2021-11-03 DIAGNOSIS — E860 Dehydration: Secondary | ICD-10-CM | POA: Diagnosis not present

## 2021-11-03 DIAGNOSIS — G500 Trigeminal neuralgia: Secondary | ICD-10-CM | POA: Diagnosis not present

## 2021-11-03 DIAGNOSIS — I639 Cerebral infarction, unspecified: Secondary | ICD-10-CM | POA: Diagnosis not present

## 2021-11-03 DIAGNOSIS — I2510 Atherosclerotic heart disease of native coronary artery without angina pectoris: Secondary | ICD-10-CM | POA: Diagnosis not present

## 2021-11-09 DIAGNOSIS — L89524 Pressure ulcer of left ankle, stage 4: Secondary | ICD-10-CM | POA: Diagnosis not present

## 2021-11-10 DIAGNOSIS — L89524 Pressure ulcer of left ankle, stage 4: Secondary | ICD-10-CM | POA: Diagnosis not present

## 2021-11-12 DIAGNOSIS — E860 Dehydration: Secondary | ICD-10-CM | POA: Diagnosis not present

## 2021-11-12 DIAGNOSIS — I517 Cardiomegaly: Secondary | ICD-10-CM | POA: Diagnosis not present

## 2021-11-12 DIAGNOSIS — R059 Cough, unspecified: Secondary | ICD-10-CM | POA: Diagnosis not present

## 2021-11-13 DIAGNOSIS — N390 Urinary tract infection, site not specified: Secondary | ICD-10-CM | POA: Diagnosis not present

## 2021-11-14 DIAGNOSIS — R5383 Other fatigue: Secondary | ICD-10-CM | POA: Diagnosis not present

## 2021-11-14 DIAGNOSIS — R7989 Other specified abnormal findings of blood chemistry: Secondary | ICD-10-CM | POA: Diagnosis not present

## 2021-11-14 DIAGNOSIS — E860 Dehydration: Secondary | ICD-10-CM | POA: Diagnosis not present

## 2021-11-14 DIAGNOSIS — R799 Abnormal finding of blood chemistry, unspecified: Secondary | ICD-10-CM | POA: Diagnosis not present

## 2021-11-16 DIAGNOSIS — L89524 Pressure ulcer of left ankle, stage 4: Secondary | ICD-10-CM | POA: Diagnosis not present

## 2021-11-17 DIAGNOSIS — R799 Abnormal finding of blood chemistry, unspecified: Secondary | ICD-10-CM | POA: Diagnosis not present

## 2021-11-17 DIAGNOSIS — R5383 Other fatigue: Secondary | ICD-10-CM | POA: Diagnosis not present

## 2021-11-17 DIAGNOSIS — R410 Disorientation, unspecified: Secondary | ICD-10-CM | POA: Diagnosis not present

## 2021-11-17 DIAGNOSIS — R7989 Other specified abnormal findings of blood chemistry: Secondary | ICD-10-CM | POA: Diagnosis not present

## 2021-11-21 DIAGNOSIS — E119 Type 2 diabetes mellitus without complications: Secondary | ICD-10-CM | POA: Diagnosis not present

## 2021-11-21 DIAGNOSIS — R296 Repeated falls: Secondary | ICD-10-CM | POA: Diagnosis not present

## 2021-11-23 DIAGNOSIS — M6281 Muscle weakness (generalized): Secondary | ICD-10-CM | POA: Diagnosis not present

## 2021-11-23 DIAGNOSIS — R262 Difficulty in walking, not elsewhere classified: Secondary | ICD-10-CM | POA: Diagnosis not present

## 2021-11-23 DIAGNOSIS — L89524 Pressure ulcer of left ankle, stage 4: Secondary | ICD-10-CM | POA: Diagnosis not present

## 2021-11-23 DIAGNOSIS — I69391 Dysphagia following cerebral infarction: Secondary | ICD-10-CM | POA: Diagnosis not present

## 2021-11-24 DIAGNOSIS — I69391 Dysphagia following cerebral infarction: Secondary | ICD-10-CM | POA: Diagnosis not present

## 2021-11-24 DIAGNOSIS — W19XXXA Unspecified fall, initial encounter: Secondary | ICD-10-CM | POA: Diagnosis not present

## 2021-11-24 DIAGNOSIS — R410 Disorientation, unspecified: Secondary | ICD-10-CM | POA: Diagnosis not present

## 2021-11-24 DIAGNOSIS — D72829 Elevated white blood cell count, unspecified: Secondary | ICD-10-CM | POA: Diagnosis not present

## 2021-11-24 DIAGNOSIS — R262 Difficulty in walking, not elsewhere classified: Secondary | ICD-10-CM | POA: Diagnosis not present

## 2021-11-24 DIAGNOSIS — M6281 Muscle weakness (generalized): Secondary | ICD-10-CM | POA: Diagnosis not present

## 2021-11-25 DIAGNOSIS — M6281 Muscle weakness (generalized): Secondary | ICD-10-CM | POA: Diagnosis not present

## 2021-11-25 DIAGNOSIS — R262 Difficulty in walking, not elsewhere classified: Secondary | ICD-10-CM | POA: Diagnosis not present

## 2021-11-25 DIAGNOSIS — N390 Urinary tract infection, site not specified: Secondary | ICD-10-CM | POA: Diagnosis not present

## 2021-11-25 DIAGNOSIS — I69391 Dysphagia following cerebral infarction: Secondary | ICD-10-CM | POA: Diagnosis not present

## 2021-11-28 DIAGNOSIS — I69391 Dysphagia following cerebral infarction: Secondary | ICD-10-CM | POA: Diagnosis not present

## 2021-11-28 DIAGNOSIS — N390 Urinary tract infection, site not specified: Secondary | ICD-10-CM | POA: Diagnosis not present

## 2021-11-28 DIAGNOSIS — R8279 Other abnormal findings on microbiological examination of urine: Secondary | ICD-10-CM | POA: Diagnosis not present

## 2021-11-28 DIAGNOSIS — R262 Difficulty in walking, not elsewhere classified: Secondary | ICD-10-CM | POA: Diagnosis not present

## 2021-11-28 DIAGNOSIS — M6281 Muscle weakness (generalized): Secondary | ICD-10-CM | POA: Diagnosis not present

## 2021-11-28 DIAGNOSIS — R221 Localized swelling, mass and lump, neck: Secondary | ICD-10-CM | POA: Diagnosis not present

## 2021-11-30 DIAGNOSIS — I69391 Dysphagia following cerebral infarction: Secondary | ICD-10-CM | POA: Diagnosis not present

## 2021-11-30 DIAGNOSIS — L97822 Non-pressure chronic ulcer of other part of left lower leg with fat layer exposed: Secondary | ICD-10-CM | POA: Diagnosis not present

## 2021-11-30 DIAGNOSIS — R262 Difficulty in walking, not elsewhere classified: Secondary | ICD-10-CM | POA: Diagnosis not present

## 2021-11-30 DIAGNOSIS — M6281 Muscle weakness (generalized): Secondary | ICD-10-CM | POA: Diagnosis not present

## 2021-11-30 DIAGNOSIS — L89524 Pressure ulcer of left ankle, stage 4: Secondary | ICD-10-CM | POA: Diagnosis not present

## 2021-12-01 DIAGNOSIS — I69391 Dysphagia following cerebral infarction: Secondary | ICD-10-CM | POA: Diagnosis not present

## 2021-12-01 DIAGNOSIS — R262 Difficulty in walking, not elsewhere classified: Secondary | ICD-10-CM | POA: Diagnosis not present

## 2021-12-01 DIAGNOSIS — M6281 Muscle weakness (generalized): Secondary | ICD-10-CM | POA: Diagnosis not present

## 2021-12-02 DIAGNOSIS — M6281 Muscle weakness (generalized): Secondary | ICD-10-CM | POA: Diagnosis not present

## 2021-12-02 DIAGNOSIS — R262 Difficulty in walking, not elsewhere classified: Secondary | ICD-10-CM | POA: Diagnosis not present

## 2021-12-02 DIAGNOSIS — I69391 Dysphagia following cerebral infarction: Secondary | ICD-10-CM | POA: Diagnosis not present

## 2021-12-05 DIAGNOSIS — M6281 Muscle weakness (generalized): Secondary | ICD-10-CM | POA: Diagnosis not present

## 2021-12-05 DIAGNOSIS — I69391 Dysphagia following cerebral infarction: Secondary | ICD-10-CM | POA: Diagnosis not present

## 2021-12-05 DIAGNOSIS — R262 Difficulty in walking, not elsewhere classified: Secondary | ICD-10-CM | POA: Diagnosis not present

## 2021-12-06 DIAGNOSIS — D649 Anemia, unspecified: Secondary | ICD-10-CM | POA: Diagnosis not present

## 2021-12-06 DIAGNOSIS — M6281 Muscle weakness (generalized): Secondary | ICD-10-CM | POA: Diagnosis not present

## 2021-12-06 DIAGNOSIS — E785 Hyperlipidemia, unspecified: Secondary | ICD-10-CM | POA: Diagnosis not present

## 2021-12-06 DIAGNOSIS — R52 Pain, unspecified: Secondary | ICD-10-CM | POA: Diagnosis not present

## 2021-12-06 DIAGNOSIS — I69391 Dysphagia following cerebral infarction: Secondary | ICD-10-CM | POA: Diagnosis not present

## 2021-12-06 DIAGNOSIS — R262 Difficulty in walking, not elsewhere classified: Secondary | ICD-10-CM | POA: Diagnosis not present

## 2021-12-07 DIAGNOSIS — I69391 Dysphagia following cerebral infarction: Secondary | ICD-10-CM | POA: Diagnosis not present

## 2021-12-07 DIAGNOSIS — M6281 Muscle weakness (generalized): Secondary | ICD-10-CM | POA: Diagnosis not present

## 2021-12-07 DIAGNOSIS — R262 Difficulty in walking, not elsewhere classified: Secondary | ICD-10-CM | POA: Diagnosis not present

## 2024-07-28 ENCOUNTER — Telehealth: Payer: Self-pay | Admitting: Neurology

## 2024-07-28 NOTE — Telephone Encounter (Signed)
 Nursing Facility called to Cancel appt due to appt not needed  appt Canceled

## 2024-07-29 ENCOUNTER — Ambulatory Visit: Admitting: Neurology

## 2024-07-31 ENCOUNTER — Ambulatory Visit: Admitting: Gastroenterology

## 2024-08-05 ENCOUNTER — Encounter

## 2024-08-10 DEATH — deceased

## 2024-08-26 ENCOUNTER — Encounter: Admitting: Vascular Surgery

## 2024-09-24 ENCOUNTER — Encounter: Admitting: Dermatology
